# Patient Record
Sex: Female | Born: 1950
Health system: Southern US, Community
[De-identification: ages and names within clinical notes are randomized; demographics above are authoritative.]

## PROBLEM LIST (undated history)

## (undated) DIAGNOSIS — C439 Malignant melanoma of skin, unspecified: Secondary | ICD-10-CM

## (undated) DIAGNOSIS — F329 Major depressive disorder, single episode, unspecified: Secondary | ICD-10-CM

## (undated) DIAGNOSIS — K219 Gastro-esophageal reflux disease without esophagitis: Secondary | ICD-10-CM

## (undated) DIAGNOSIS — F32A Depression, unspecified: Secondary | ICD-10-CM

## (undated) DIAGNOSIS — R9439 Abnormal result of other cardiovascular function study: Secondary | ICD-10-CM

## (undated) HISTORY — DX: Malignant melanoma of skin, unspecified: C43.9

## (undated) HISTORY — DX: Major depressive disorder, single episode, unspecified: F32.9

## (undated) HISTORY — DX: Abnormal result of other cardiovascular function study: R94.39

## (undated) HISTORY — PX: OTHER SURGICAL HISTORY: SHX169

## (undated) HISTORY — DX: Gastro-esophageal reflux disease without esophagitis: K21.9

## (undated) HISTORY — DX: Depression, unspecified: F32.A

---
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
## 1991-06-17 HISTORY — PX: ABDOMINAL HYSTERECTOMY: SHX81

## 2013-06-16 HISTORY — PX: BREAST BIOPSY: SHX20

## 2015-01-11 ENCOUNTER — Telehealth: Payer: Self-pay | Admitting: Family Medicine

## 2015-01-11 NOTE — Telephone Encounter (Signed)
Pt has new pt appt sch for 02-05-15. Pt can not kept that appt and would like to resch. Can I resch pt did not give a reason?

## 2015-01-11 NOTE — Telephone Encounter (Signed)
Pt has been re-sch

## 2015-01-11 NOTE — Telephone Encounter (Signed)
Ok

## 2015-01-11 NOTE — Telephone Encounter (Signed)
lmom for pt to call back

## 2015-02-05 ENCOUNTER — Ambulatory Visit: Payer: Self-pay | Admitting: Family Medicine

## 2015-03-06 ENCOUNTER — Encounter: Payer: Self-pay | Admitting: Family Medicine

## 2015-03-06 ENCOUNTER — Ambulatory Visit (INDEPENDENT_AMBULATORY_CARE_PROVIDER_SITE_OTHER): Payer: BLUE CROSS/BLUE SHIELD | Admitting: Family Medicine

## 2015-03-06 VITALS — BP 122/70 | HR 86 | Temp 97.9°F | Ht 64.0 in | Wt 134.2 lb

## 2015-03-06 DIAGNOSIS — F411 Generalized anxiety disorder: Secondary | ICD-10-CM | POA: Diagnosis not present

## 2015-03-06 DIAGNOSIS — M62838 Other muscle spasm: Secondary | ICD-10-CM

## 2015-03-06 DIAGNOSIS — G4733 Obstructive sleep apnea (adult) (pediatric): Secondary | ICD-10-CM

## 2015-03-06 DIAGNOSIS — G4710 Hypersomnia, unspecified: Secondary | ICD-10-CM

## 2015-03-06 DIAGNOSIS — R400 Somnolence: Secondary | ICD-10-CM

## 2015-03-06 DIAGNOSIS — E039 Hypothyroidism, unspecified: Secondary | ICD-10-CM | POA: Diagnosis not present

## 2015-03-06 DIAGNOSIS — R0789 Other chest pain: Secondary | ICD-10-CM

## 2015-03-06 NOTE — Progress Notes (Signed)
Pre visit review using our clinic review tool, if applicable. No additional management support is needed unless otherwise documented below in the visit note. 

## 2015-03-06 NOTE — Patient Instructions (Signed)
BEFORE YOU LEAVE: -schedule follow up in 2 months  -We placed a referral for you as discussed to the neurologist for the sensations in your leg and for a stress test for your heart for the spells you have had. It usually takes about 1-2 weeks to process and schedule this referral. If you have not heard from [place] regarding this appointment in 2 weeks please contact our office.  Please start cognitive behavioral therapy for stress and possible panic.  Please talk with your integrative medicine doctor about the symptoms you are having and the possibility of interactions or side effects related to your supplements and hormones.

## 2015-03-06 NOTE — Progress Notes (Signed)
HPI:  Olivia Morrison is here to establish care. Has not had a family doctor in some time. She goes to [facility] for her hypothyroidism and her hot flashes. She had extensive labs (CMP, hormones, TFTs, vit D, cortisol)  recently with her integrative medicine doctor. Copy sent to scan - reviewed. She is on nature-thryoid, estradiol and testosterone cream. She is working on eating a healthy diet. Her mother and brother in law died recently. See seems to be doing ok with this. Sees Dr. Edwyna Shell for her pap smears in women's exams. For > 3 years she has fairly frequent muscle twitches in both calves. Denies weakness, numbness, bowel and bladder incontinence. She reports she saw a neurologist for this and they thought might be restless leg - reports no studies or labs done. She was not happy with this neurologist and would like my recommendations to see another. Better with essential oils. Husband thinks has sleep apnea - unrestful sleep, snoring, apneic spells in sleep and daytime somnolence. Occ nightmares. Occ feels very tired, mild CP, SOB for a few minutes - this has occurred 3 times in the last several years. She takes a very long list of supplements. Hx of severe manic depression and GAD. She refuses all vaccines.  ROS negative for unless reported above: fevers, unintentional weight loss, hearing or vision loss, hemoptysis, melena, hematochezia, hematuria, falls, loc, si, thoughts of self harm  Past Medical History  Diagnosis Date  . Depression   . GERD (gastroesophageal reflux disease)   . Hypertension     Past Surgical History  Procedure Laterality Date  . Abdominal hysterectomy    . Breast biopsy Right 2015  . Tonsillectomy  1959    Family History  Problem Relation Age of Onset  . Alcoholism Father   . Arthritis Mother   . Arthritis Sister   . Arthritis Father   . Breast cancer      Paternal cousin  . Heart disease Father   . Heart disease Mother   .  Hypertension Father   . Hypertension Mother   . Mental illness Maternal Aunt     Social History   Social History  . Marital Status: Married    Spouse Name: N/A  . Number of Children: N/A  . Years of Education: N/A   Social History Main Topics  . Smoking status: Not on file  . Smokeless tobacco: Not on file  . Alcohol Use: Not on file  . Drug Use: Not on file  . Sexual Activity: Not on file   Other Topics Concern  . Not on file   Social History Narrative  . No narrative on file     Current outpatient prescriptions:  .  5-Methyltetrahydrofolate (METHYL FOLATE) POWD, by Does not apply route., Disp: , Rfl:  .  Ascorbic Acid (VITAMIN C) 1000 MG tablet, Take 1,000 mg by mouth daily., Disp: , Rfl:  .  ASHWAGANDHA PO, Take by mouth., Disp: , Rfl:  .  Cholecalciferol (VITAMIN D3) 50000 UNITS CAPS, Take by mouth., Disp: , Rfl:  .  Coenzyme Q10 (CO Q-10) 100 MG CAPS, Take by mouth., Disp: , Rfl:  .  estradiol (VIVELLE-DOT) 0.05 MG/24HR patch, , Disp: , Rfl: 4 .  flurazepam (DALMANE) 15 MG capsule, Take 15 mg by mouth. Twice a month, Disp: , Rfl:  .  Magnesium Malate POWD, by Does not apply route., Disp: , Rfl:  .  Menaquinone-7 (VITAMIN K2 PO), Take by mouth., Disp: , Rfl:  .  Multiple Vitamins-Minerals (MULTIVITAMIN PO), Take by mouth., Disp: , Rfl:  .  NATURE-THROID 65 MG tablet, , Disp: , Rfl: 2 .  NON FORMULARY, Testosterone cream, Disp: , Rfl:  .  NON FORMULARY, Pregnelone, Disp: , Rfl:  .  NON FORMULARY, Bio-curcumin 400mg , Disp: , Rfl:  .  Omega-3 Fatty Acids (OMEGA 3 PO), Take by mouth., Disp: , Rfl:  .  Probiotic Product (PROBIOTIC PO), Take by mouth., Disp: , Rfl:  .  Progesterone Micronized (PROGESTERONE PO), Take by mouth., Disp: , Rfl:   EXAM:  Filed Vitals:   03/06/15 1437  BP: 122/70  Pulse: 86  Temp: 97.9 F (36.6 C)    Body mass index is 23.02 kg/(m^2).  GENERAL: vitals reviewed and listed above, alert, oriented, appears well hydrated and in no acute  distress  HEENT: atraumatic, conjunttiva clear, no obvious abnormalities on inspection of external nose and ears  NECK: no obvious masses on inspection  LUNGS: clear to auscultation bilaterally, no wheezes, rales or rhonchi, good air movement  CV: HRRR, no peripheral edema  MS: moves all extremities without noticeable abnormality  PSYCH: pleasant and cooperative, no obvious depression or anxiety  ASSESSMENT AND PLAN:  Discussed the following assessment and plan:  Hypothyroidism, unspecified hypothyroidism type  Atypical chest pain - Plan: Exercise Tolerance Test  GAD (generalized anxiety disorder)  Muscle spasm of both lower legs - Plan: Ambulatory referral to Neurology  Daytime somnolence - Plan: Ambulatory referral to Pulmonology  OSA (obstructive sleep apnea) - Plan: Ambulatory referral to Pulmonology   I am concerned with the number of supplements and hormones she is taking and wonder if any of these, along with anxiety could be contributing to some of her symptoms. Advise follow up with her integrative doc regarding supplements and hormones. He labs look ok on recent testing - scanned. Will have her see neurology for what sounds like chronic constant worsening muscle twitching in the lower extremities - referral placed. Will do stress test for her intermittent spells of atypical cp with dyspnea - query panic disorder and advised tx - she prefers to see her counselor. Consider holter monitor if persists. Referral per her request for testing for OSA with pulmonologist. Follow up in 1-2 months. -We reviewed the PMH, PSH, FH, SH, Meds and Allergies. -We provided refills for any medications we will prescribe as needed. -We addressed current concerns per orders and patient instructions. -We have asked for records for pertinent exams, studies, vaccines and notes from previous providers. -We have advised patient to follow up per instructions below.   -Patient advised to return or  notify a doctor immediately if symptoms worsen or persist or new concerns arise.  Patient Instructions  BEFORE YOU LEAVE: -schedule follow up in 2 months  -We placed a referral for you as discussed to the neurologist for the sensations in your leg and for a stress test for your heart for the spells you have had. It usually takes about 1-2 weeks to process and schedule this referral. If you have not heard from [place] regarding this appointment in 2 weeks please contact our office.  Please start cognitive behavioral therapy for stress and possible panic.  Please talk with your integrative medicine doctor about the symptoms you are having and the possibility of interactions or side effects related to your supplements and hormones.     Colin Benton R.

## 2015-04-05 ENCOUNTER — Inpatient Hospital Stay (HOSPITAL_COMMUNITY): Admission: RE | Admit: 2015-04-05 | Payer: BLUE CROSS/BLUE SHIELD | Source: Ambulatory Visit

## 2015-04-16 ENCOUNTER — Ambulatory Visit (INDEPENDENT_AMBULATORY_CARE_PROVIDER_SITE_OTHER): Payer: BLUE CROSS/BLUE SHIELD | Admitting: Neurology

## 2015-04-16 ENCOUNTER — Encounter: Payer: Self-pay | Admitting: Neurology

## 2015-04-16 VITALS — BP 140/80 | HR 64 | Ht 64.0 in | Wt 136.4 lb

## 2015-04-16 DIAGNOSIS — G3184 Mild cognitive impairment, so stated: Secondary | ICD-10-CM | POA: Diagnosis not present

## 2015-04-16 DIAGNOSIS — G709 Myoneural disorder, unspecified: Secondary | ICD-10-CM

## 2015-04-16 DIAGNOSIS — R253 Fasciculation: Secondary | ICD-10-CM

## 2015-04-16 NOTE — Patient Instructions (Addendum)
Return to clinic if your symptoms worsen.

## 2015-04-16 NOTE — Progress Notes (Signed)
[facility] Clinic Note - Initial Visit   Date: 04/16/2015  Olivia Morrison MRN: [id number] DOB: 1951/01/30   Dear Dr. Maudie Mercury:  Thank you for your kind referral of Olivia Morrison for consultation of muscle twitches. Although her history is well known to you, please allow [place] to reiterate it for the purpose of our medical record. The patient was accompanied to the clinic by self.    History of Present Illness: Olivia Morrison is a 64 y.o. right-handed Caucasian female with depression, GERD, hypertension, and hypothyroidism presenting for evaluation of muscle twitches.    Since 2013, she has noticed painless muscle twitches involves both calves. Symptoms are variable and usually most noticeable when still.   It is improved by tonic water and not as severe, now that she is off her supplements (vitamin K, magnesium, probiotic, vitamin D, coenzyme Q10, vitamin D, pregnelone, curcumin, ashwaganda being managed by Integrative Therapy). She cannot trigger.   There is no associated muscle weakness, numbness/tingling, or low back pain.  She previously saw a neurologist in [facility] who started her on neurontin, but she did not tolerate this.    She also complain of muscle cramps involving the feet.  Her mother also used to complain of muscle cramps, but no muscle twitches.    She also complain of memory problems and word-finding difficulty, which her children have also noticed.   She had ECT for depression in 2000.  Out-side paper records, electronic medical record, and images have been reviewed where available and summarized as:  Labs 01/23/2015:  CRP 2.02, vitamin B12 835, fT4 0.96, vitamin D 61, GGT 46    Past Medical History  Diagnosis Date  . Depression   . GERD (gastroesophageal reflux disease)   . Hypertension     Past Surgical History  Procedure Laterality Date  . Abdominal hysterectomy    . Breast biopsy Right 2015  . Tonsillectomy  1959      Medications:  Outpatient Encounter Prescriptions as of 04/16/2015  Medication Sig Note  . 5-Methyltetrahydrofolate (METHYL FOLATE) POWD by Does not apply route.   . Ascorbic Acid (VITAMIN C) 1000 MG tablet Take 1,000 mg by mouth daily.   . ASHWAGANDHA PO Take by mouth.   . Cholecalciferol (VITAMIN D3) 50000 UNITS CAPS Take by mouth.   . Coenzyme Q10 (CO Q-10) 100 MG CAPS Take by mouth.   . estradiol (VIVELLE-DOT) 0.05 MG/24HR patch  03/06/2015: Received from: External Pharmacy  . flurazepam (DALMANE) 15 MG capsule Take 15 mg by mouth. Twice a month   . Magnesium Malate POWD by Does not apply route.   . Menaquinone-7 (VITAMIN K2 PO) Take by mouth.   . Multiple Vitamins-Minerals (MULTIVITAMIN PO) Take by mouth.   Marland Kitchen NATURE-THROID 65 MG tablet  03/06/2015: Received from: External Pharmacy  . NON FORMULARY Testosterone cream   . NON FORMULARY Pregnelone   . NON FORMULARY Bio-curcumin 400mg    . Omega-3 Fatty Acids (OMEGA 3 PO) Take by mouth.   . Probiotic Product (PROBIOTIC PO) Take by mouth.   . Progesterone Micronized (PROGESTERONE PO) Take by mouth.    No facility-administered encounter medications on file as of 04/16/2015.     Allergies:  Allergies  Allergen Reactions  . Tetanus Toxoids Other (See Comments)    Symptoms of lockjaw and flu per patient    Family History: Family History  Problem Relation Age of Onset  . Alcoholism Father   . Arthritis Mother   . Arthritis Sister   .  Arthritis Father   . Breast cancer      Paternal cousin  . Heart disease Father   . Heart disease Mother   . Hypertension Father   . Hypertension Mother   . Mental illness Maternal Aunt     Social History: Social History  Substance Use Topics  . Smoking status: Former Research scientist (life sciences)  . Smokeless tobacco: Never Used  . Alcohol Use: 0.0 oz/week    0 Standard drinks or equivalent per week   Social History   Social History Narrative    Review of Systems:  CONSTITUTIONAL: No fevers,  chills, night sweats, or weight loss.   EYES: No visual changes or eye pain ENT: No hearing changes.  No history of nose bleeds.   RESPIRATORY: No cough, wheezing and shortness of breath.   CARDIOVASCULAR: Negative for chest pain, and palpitations.   GI: Negative for abdominal discomfort, blood in stools or black stools.  No recent change in bowel habits.   GU:  No history of incontinence.   MUSCLOSKELETAL: No history of joint pain or swelling.  No myalgias.   SKIN: Negative for lesions, rash, and itching.   HEMATOLOGY/ONCOLOGY: Negative for prolonged bleeding, bruising easily, and swollen nodes.  No history of cancer.   ENDOCRINE: Negative for cold or heat intolerance, polydipsia or goiter.   PSYCH:  +depression or anxiety symptoms.   NEURO: As Above.   Vital Signs:  BP 140/80 mmHg  Pulse 64  Ht 5\' 4"  (1.626 m)  Wt 136 lb 7 oz (61.888 kg)  BMI 23.41 kg/m2  SpO2 99% Pain Scale: 0 on a scale of 0-10  Montreal Cognitive Assessment  04/16/2015  Visuospatial/ Executive (0/5) 5  Naming (0/3) 3  Attention: Read list of digits (0/2) 2  Attention: Read list of letters (0/1) 1  Attention: Serial 7 subtraction starting at 100 (0/3) 3  Language: Repeat phrase (0/2) 2  Language : Fluency (0/1) 1  Abstraction (0/2) 2  Delayed Recall (0/5) 4  Orientation (0/6) 6  Total 29  Adjusted Score (based on education) 29    General Medical Exam:   General:  Well appearing, comfortable.   Eyes/ENT: see cranial nerve examination.   Neck: No masses appreciated.  Full range of motion without tenderness.  No carotid bruits. Respiratory:  Clear to auscultation, good air entry bilaterally.   Cardiac:  Regular rate and rhythm, no murmur.   Extremities:  No deformities, edema, or skin discoloration.  Skin:  No rashes or lesions.  Neurological Exam: MENTAL STATUS including orientation to time, place, person, recent and remote memory, attention span and concentration, language, and fund of knowledge is  normal.  Speech is not dysarthric.  CRANIAL NERVES: II:  No visual field defects.  Unremarkable fundi.   III-IV-VI: Pupils equal round and reactive to light.  Normal conjugate, extra-ocular eye movements in all directions of gaze.  No nystagmus.  No ptosis.   V:  Normal facial sensation.     VII:  Normal facial symmetry and movements.  No pathologic facial reflexes.  VIII:  Normal hearing and vestibular function.   IX-X:  Normal palatal movement.   XI:  Normal shoulder shrug and head rotation.   XII:  Normal tongue strength and range of motion, no deviation or fasciculation.  MOTOR:  No atrophy, fasciculations or abnormal movements.  No pronator drift.  Tone is normal.    Right Upper Extremity:    Left Upper Extremity:    Deltoid  5/5   Deltoid  5/5   Biceps  5/5   Biceps  5/5   Triceps  5/5   Triceps  5/5   Wrist extensors  5/5   Wrist extensors  5/5   Wrist flexors  5/5   Wrist flexors  5/5   Finger extensors  5/5   Finger extensors  5/5   Finger flexors  5/5   Finger flexors  5/5   Dorsal interossei  5/5   Dorsal interossei  5/5   Abductor pollicis  5/5   Abductor pollicis  5/5   Tone (Ashworth scale)  0  Tone (Ashworth scale)  0   Right Lower Extremity:    Left Lower Extremity:    Hip flexors  5/5   Hip flexors  5/5   Hip extensors  5/5   Hip extensors  5/5   Knee flexors  5/5   Knee flexors  5/5   Knee extensors  5/5   Knee extensors  5/5   Dorsiflexors  5/5   Dorsiflexors  5/5   Plantarflexors  5/5   Plantarflexors  5/5   Toe extensors  5/5   Toe extensors  5/5   Toe flexors  5/5   Toe flexors  5/5   Tone (Ashworth scale)  0  Tone (Ashworth scale)  0   MSRs:  Right                                                                 Left brachioradialis 2+  brachioradialis 2+  biceps 2+  biceps 2+  triceps 2+  triceps 2+  patellar 2+  patellar 2+  ankle jerk 2+  ankle jerk 2+  Hoffman no  Hoffman no  plantar response down  plantar response down   SENSORY:  Normal and  symmetric perception of light touch, pinprick, vibration, and proprioception.  Romberg's sign absent.   COORDINATION/GAIT: Normal finger-to- nose-finger and heel-to-shin.  Intact rapid alternating movements bilaterally.  Able to rise from a chair without using arms.  Gait narrow based and stable. Tandem and stressed gait intact.    IMPRESSION: Ms. Inclan is a 64 year-old female presenting for evaluation of benign fasciculations, muscle cramps, and memory changes.  Her exam is entirely normal and non-focal.  I reassured her that she most likely has benign fasciculation-cramp syndrome and offered muscle relaxants at bedtime, but she declined.  Alternatively, she should perform posterior leg stretches before bedtime, stay well-hydrated, and continue to avoid supplements, especially as this has helped over the past week.  I can certainly perform NCS/EMG of the legs if she developed worsening symptoms or weakness.   She also may have mild cognitive impairment, but scored extremely well on MOCA with 29/30.  No signs of dementia which she was relieved by.  She does endorse stress and anxiety related to the recent passing of her mother.  Encouraged her to stay physically and mentally active.    Return to clinic if symptoms worsen.    The duration of this appointment visit was 50 minutes of face-to-face time with the patient.  Greater than 50% of this time was spent in counseling, explanation of diagnosis, planning of further management, and coordination of care.   Thank you for allowing me to participate in patient's care.  If  I can answer any additional questions, I would be pleased to do so.    Sincerely,    Siddhanth Denk K. Posey Pronto, DO

## 2015-04-17 ENCOUNTER — Telehealth: Payer: Self-pay | Admitting: *Deleted

## 2015-04-17 NOTE — Telephone Encounter (Signed)
04/13/2015--Dr Kim received lab tests from [place] and wanted to know if the physician that ordered these tests will be following the pt due to elevated calcium and TSH levels and if not to have her come here or see the integrative med physician.  I called Dr Lorie Apley office and spoke with Butch Penny and she stated they are the pts GI doctor and advised the PCP follow the pt for this.  I called the pt and informed her of this and per Dr Maudie Mercury she should have the tests repeated here in 1 month or with the integrative med physician and she stated she has an upcoming appt.  I also advised her per Dr Maudie Mercury to discontinue any supplements she may be taking and she agreed.

## 2015-04-18 ENCOUNTER — Encounter: Payer: Self-pay | Admitting: Internal Medicine

## 2015-04-18 ENCOUNTER — Telehealth (HOSPITAL_COMMUNITY): Payer: Self-pay

## 2015-04-18 ENCOUNTER — Ambulatory Visit (INDEPENDENT_AMBULATORY_CARE_PROVIDER_SITE_OTHER): Payer: BLUE CROSS/BLUE SHIELD | Admitting: Internal Medicine

## 2015-04-18 VITALS — BP 130/78 | HR 70 | Ht 64.0 in | Wt 138.0 lb

## 2015-04-18 DIAGNOSIS — G4733 Obstructive sleep apnea (adult) (pediatric): Secondary | ICD-10-CM | POA: Diagnosis not present

## 2015-04-18 MED ORDER — FAMOTIDINE 20 MG PO TABS: ORAL_TABLET | ORAL | Status: DC

## 2015-04-18 MED ORDER — ALPRAZOLAM 0.5 MG PO TABS: ORAL_TABLET | ORAL | Status: DC

## 2015-04-18 NOTE — Progress Notes (Signed)
Subjective:     Patient ID: Olivia Morrison, female   DOB: 05/29/1951  MRN: [id number]  HPI  34 yowf quit smoking 2008 p only 5 years with chronic insomnia x then better p rx with dalmane per GYN  last used sept 2016 but only used maybe once a month referred to pulmonary clinic 04/18/2015 by Dr Colin Benton for sleep eval.   04/18/2015 1st [facility] office visit/ Olivia Morrison   Chief Complaint  Patient presents with  . Sleep Consult    Referred by Dr Colin Benton. Pt c/o snoring, trouble staying asleep, vivid dreams and nightmares. She has not yet had PSG.   snoring was bad enough to wake her husband but on dalmane slept thru it and woke up feeling refreshed but doesn't want to get addicted to benzo's  Epworth score = 10  Poor sleep hygiene, watches TV until bedtime. ? Some restless legs also with cramping > see neuro eval 04/16/15  Sometimes has cp and overt hb at hs not takng anything for it / never happens with exercise  No obvious day to day or daytime variability or assoc chronic cough  or chest tightness, subjective wheeze or overt sinus  symptoms. No unusual exp hx or h/o childhood pna/ asthma or knowledge of premature birth.   Also denies any obvious fluctuation of symptoms with weather or environmental changes or other aggravating or alleviating factors except as outlined above   Current Medications, Allergies, Complete Past Medical History, Past Surgical History, Family History, and Social History were reviewed in [employer] record.  ROS  The following are not active complaints unless bolded sore throat, dysphagia, dental problems, itching, sneezing,  nasal congestion or excess/ purulent secretions, ear ache,   fever, chills, sweats, unintended wt loss, classically pleuritic or exertional cp, hemoptysis,  orthopnea pnd or leg swelling, presyncope, palpitations, abdominal pain, anorexia, nausea, vomiting, diarrhea  or change in bowel or bladder habits, change in  stools or urine, dysuria,hematuria,  rash, arthralgias, visual complaints, headache, numbness, weakness or ataxia or problems with walking or coordination,  change in mood/affect or memory.       Review of Systems     Objective:   Physical Exam amb wf nad  Wt Readings from Last 3 Encounters:  04/18/15 138 lb (62.596 kg)  04/16/15 136 lb 7 oz (61.888 kg)  03/06/15 134 lb 3.2 oz (60.873 kg)    Vital signs reviewed   HEENT: nl dentition, turbinates, and oropharynx. Nl external ear canals without cough reflex Modified Mallampati Score = 1       NECK :  without JVD/Nodes/TM/ nl carotid upstrokes bilaterally   LUNGS: no acc muscle use, clear to A and P bilaterally without cough on insp or exp maneuvers   CV:  RRR  no s3 or murmur or increase in P2, no edema   ABD:  soft and nontender with nl excursion in the supine position. No bruits or organomegaly, bowel sounds nl  MS:  warm without deformities, calf tenderness, cyanosis or clubbing  SKIN: warm and dry without lesions    NEURO:  alert, approp, no deficits        Assessment:

## 2015-04-18 NOTE — Telephone Encounter (Signed)
Encounter complete. 

## 2015-04-18 NOTE — Patient Instructions (Addendum)
Please see patient coordinator before you leave today  to schedule split night sleep study and I will refer you to one of our sleep docs if it's positive   Read by a dull light for about 30 minutes before sleeping  - repeat if wake up and can't go back to sleep within 10 minutes and if all else fails ok to take the xanax 0.5 mg one half to one at bedtime on occasions  Pepcid 20 mg at bedtime to see if any of the night time chest discomfort improves  GERD (REFLUX)  is an extremely common cause of respiratory symptoms just like yours , many times with no obvious heartburn at all.    It can be treated with medication, but also with lifestyle changes including elevation of the head of your bed (ideally with 6 inch  bed blocks),  Smoking cessation, avoidance of late meals, excessive alcohol, and avoid fatty foods, chocolate, peppermint, colas, red wine, and acidic juices such as orange juice.  NO MINT OR MENTHOL PRODUCTS SO NO COUGH DROPS  USE SUGARLESS CANDY INSTEAD (Jolley ranchers or Stover's or Life Savers) or even ice chips will also do - the key is to swallow to prevent all throat clearing. NO OIL BASED VITAMINS - use powdered substitutes.

## 2015-04-19 ENCOUNTER — Encounter: Payer: Self-pay | Admitting: Internal Medicine

## 2015-04-19 NOTE — Assessment & Plan Note (Addendum)
Interesting hx of elimination of symptoms with benzo's but still disturbed her husband so doesn't want to use them.  She certainly does not have the usual body habitus for osa and actually may not have it all based on the hx and exam but rec proceed with split night study then trial of cpap if approp and f/u with one of our sleep docs  In meantime ok to use xanax 0.5 up to one at hs but not on a regular basis and try GERD recs/ pepcid 20 mg qhs x one month just to see if any of her noct complaints are gerd related (the cp at hs but not with ex is suggestive)   I had an extended discussion with the patient reviewing all relevant studies completed to date and  lasting 63minutes of a 60 minute visit    Sleep hygiene reviewed  Discussed in detail all the  indications, usual  risks and alternatives  relative to the benefits with patient who agrees to proceed with w/u as outlined     Each maintenance medication was reviewed in detail including most importantly the difference between maintenance and prns and under what circumstances the prns are to be triggered using an action plan format that is not reflected in the computer generated alphabetically organized AVS.    Please see instructions for details which were reviewed in writing and the patient given a copy highlighting the part that I personally wrote and discussed at today's ov.

## 2015-04-20 ENCOUNTER — Ambulatory Visit (HOSPITAL_COMMUNITY)
Admission: RE | Admit: 2015-04-20 | Discharge: 2015-04-20 | Disposition: A | Discharge status: Home / Self Care | Payer: BLUE CROSS/BLUE SHIELD | Source: Ambulatory Visit | Attending: Cardiovascular Disease | Admitting: Cardiovascular Disease

## 2015-04-20 ENCOUNTER — Telehealth (HOSPITAL_COMMUNITY): Payer: Self-pay

## 2015-04-20 DIAGNOSIS — R0789 Other chest pain: Secondary | ICD-10-CM | POA: Diagnosis not present

## 2015-04-20 LAB — EXERCISE TOLERANCE TEST
CHL RATE OF PERCEIVED EXERTION: 16
CSEPED: 10 min
CSEPEDS: 1 s
CSEPEW: 11.7 METS
MPHR: 158 {beats}/min
Peak HR: 173 {beats}/min
Percent HR: 109 %
Rest HR: 83 {beats}/min

## 2015-04-20 NOTE — Telephone Encounter (Signed)
Encounter complete. 

## 2015-04-23 ENCOUNTER — Other Ambulatory Visit: Payer: Self-pay | Admitting: *Deleted

## 2015-04-23 DIAGNOSIS — R9439 Abnormal result of other cardiovascular function study: Secondary | ICD-10-CM

## 2015-04-24 ENCOUNTER — Telehealth: Payer: Self-pay | Admitting: Family Medicine

## 2015-04-24 NOTE — Telephone Encounter (Signed)
Talked with pt. She has appt with cardiology next week. No cardiac symptoms currently. She plans to call pulm about sleep study timing.

## 2015-04-24 NOTE — Telephone Encounter (Signed)
Pt would like to know if she should go ahead with the sleep study on Wed night considering her stress test results. Pt would also like a call back from [facility] or Dr Maudie Mercury to go over specifics of reuslts from stress test.  Pt would like a call back asap.

## 2015-04-25 ENCOUNTER — Ambulatory Visit (HOSPITAL_BASED_OUTPATIENT_CLINIC_OR_DEPARTMENT_OTHER): Payer: BLUE CROSS/BLUE SHIELD | Attending: Internal Medicine | Admitting: Radiology

## 2015-04-25 DIAGNOSIS — R0683 Snoring: Secondary | ICD-10-CM | POA: Insufficient documentation

## 2015-04-25 DIAGNOSIS — R5383 Other fatigue: Secondary | ICD-10-CM | POA: Insufficient documentation

## 2015-04-25 DIAGNOSIS — G4733 Obstructive sleep apnea (adult) (pediatric): Secondary | ICD-10-CM

## 2015-04-25 DIAGNOSIS — G4736 Sleep related hypoventilation in conditions classified elsewhere: Secondary | ICD-10-CM | POA: Insufficient documentation

## 2015-04-26 DIAGNOSIS — G4710 Hypersomnia, unspecified: Secondary | ICD-10-CM | POA: Diagnosis not present

## 2015-04-26 NOTE — Progress Notes (Signed)
Patient Name: Olivia Morrison, Olivia Morrison Date: 04/25/2015 Gender: Female D.O.B: [birth year] Age (years): 74 Referring Provider: Tanda Rockers Height (inches): 87 Interpreting Physician: Kara Mead MD, ABSM Weight (lbs): 136 RPSGT: Laren Everts BMI: 23 MRN: [id number] Neck Size: 12.00   CLINICAL INFORMATION Sleep Study Type: NPSG Indication for sleep study: Fatigue, OSA, Snoring, Witnessed Apneas Epworth Sleepiness Score: 10   SLEEP STUDY TECHNIQUE As per the AASM Manual for the Scoring of Sleep and Associated Events v2.3 (April 2016) with a hypopnea requiring 4% desaturations. The channels recorded and monitored were frontal, central and occipital EEG, electrooculogram (EOG), submentalis EMG (chin), nasal and oral airflow, thoracic and abdominal wall motion, anterior tibialis EMG, snore microphone, electrocardiogram, and pulse oximetry.   MEDICATIONS  Medications self-administered by patient during sleep study : No sleep medicine administered.   SLEEP ARCHITECTURE The study was initiated at 10:06:53 PM and ended at 4:48:03 AM. Sleep onset time was 76.2 minutes and the sleep efficiency was 70.8%. The total sleep time was 284.0 minutes. Stage REM latency was 68.5 minutes. The patient spent 6.16% of the night in stage N1 sleep, 71.83% in stage N2 sleep, 0.00% in stage N3 and 22.01% in REM. Alpha intrusion was absent. Supine sleep was 59.16%.   RESPIRATORY PARAMETERS The overall apnea/hypopnea index (AHI) was 1.1 per hour. There were 5 total apneas, including 4 obstructive, 1 central and 0 mixed apneas. There were 0 hypopneas and 34 RERAs. The AHI during Stage REM sleep was 3.8 per hour. AHI while supine was 1.4 per hour. The mean oxygen saturation was 95.73%. Moderate snoring was noted during this study.   CARDIAC DATA The 2 lead EKG demonstrated sinus rhythm. The mean heart rate was 68.45 beats per minute. Other EKG findings include: None.   LEG MOVEMENT DATA The total  PLMS were 1 with a resulting PLMS index of 0.21. Associated arousal with leg movement index was 0.2 .   IMPRESSIONS - No significant obstructive sleep apnea occurred during this study (AHI = 1.1/h). - No significant central sleep apnea occurred during this study (CAI = 0.2/h). - No significant oxygen desaturation was noted during this study  - The patient snored with Moderate snoring volume. - No cardiac abnormalities were noted during this study. - Clinically significant periodic limb movements did not occur during sleep. No significant associated arousals.   DIAGNOSIS - Nocturnal Hypoxemia (327.26 [G47.36 ICD-10])   RECOMMENDATIONS - Avoid alcohol, sedatives and other CNS depressants that may worsen sleep apnea and disrupt normal sleep architecture. - Sleep hygiene should be reviewed to assess factors that may improve sleep quality.  Kara Mead MD. [facility]. [facility]

## 2015-04-30 ENCOUNTER — Telehealth: Payer: Self-pay | Admitting: Internal Medicine

## 2015-04-30 NOTE — Telephone Encounter (Signed)
Let her know sleep study ok, no intervention needed - have her set up f/u to discuss details and get a plan to correct her sleep issues in next 6 weeks or so     ----- Message -----     From: Rigoberto Noel, MD     Sent: 04/26/2015 12:48 PM      To: Leanor Rubenstein, RRT, Tanda Rockers, MD      Spoke with pt and notified of results per Dr. Melvyn Novas. Pt verbalized understanding and denied any questions.  Dr Melvyn Novas- there are no openings with any sleep doc in the next 6 wks. Is it okay to schedule her for first available? Please advise, thanks

## 2015-04-30 NOTE — Telephone Encounter (Signed)
6wk f/u sched for 06/13/15 w/MW, pt cb

## 2015-04-30 NOTE — Telephone Encounter (Signed)
Sorry to confuse - she doesn't need a sleep doc because her sleep study was ok, I was just offering to go over it with her in more detail and come up with a plan going forward to address her sleep issues

## 2015-04-30 NOTE — Telephone Encounter (Signed)
lmtcb for pt.  

## 2015-05-01 ENCOUNTER — Ambulatory Visit (INDEPENDENT_AMBULATORY_CARE_PROVIDER_SITE_OTHER): Payer: BLUE CROSS/BLUE SHIELD | Admitting: Interventional Cardiology

## 2015-05-01 ENCOUNTER — Encounter: Payer: Self-pay | Admitting: Interventional Cardiology

## 2015-05-01 VITALS — BP 144/92 | HR 73 | Ht 64.0 in | Wt 134.2 lb

## 2015-05-01 DIAGNOSIS — E785 Hyperlipidemia, unspecified: Secondary | ICD-10-CM | POA: Insufficient documentation

## 2015-05-01 DIAGNOSIS — R9430 Abnormal result of cardiovascular function study, unspecified: Secondary | ICD-10-CM

## 2015-05-01 DIAGNOSIS — M549 Dorsalgia, unspecified: Secondary | ICD-10-CM

## 2015-05-01 DIAGNOSIS — R9431 Abnormal electrocardiogram [ECG] [EKG]: Secondary | ICD-10-CM | POA: Insufficient documentation

## 2015-05-01 NOTE — Patient Instructions (Signed)
Medication Instructions:  Your physician recommends that you continue on your current medications as directed. Please refer to the Current Medication list given to you today.   Labwork: None ordered  Testing/Procedures: Your physician has requested that you have en exercise stress myoview. For further information please visit [email or web address]. Please follow instruction sheet, as given.   Follow-Up: Your physician recommends that you schedule a follow-up appointment as needed or pending results   Any Other Special Instructions Will Be Listed Below (If Applicable).     If you need a refill on your cardiac medications before your next appointment, please call your pharmacy.

## 2015-05-01 NOTE — Progress Notes (Signed)
Cardiology Office Note   Date:  05/01/2015   ID:  Olivia Morrison, DOB 07-21-1950, MRN [id number]  PCP:  Lucretia Kern., DO  Cardiologist:  Sinclair Grooms, MD   Chief Complaint  Patient presents with  . Results    abnormal stress test      History of Present Illness: Olivia Morrison is a 64 y.o. female who presents for abnormal exercise EKG.  The patient went through a series of tests including an exercise EKG to evaluate the presence of fatigue and other atypical complaints.. She has had intermittent interscapular discomfort after long periods soaking in the bathtub. These episodes are sharp and always occur when she is getting out of the bathtub. This has occurred on 2 occasions. She has otherwise had no similar complaints. She walked 10 minutes on the treadmill without chest discomfort or back discomfort. She did have 2 mm of ST depression in leads II, III, and aVF. I was unable to personally review the EKG changes.  There is a history of hyperlipidemia. Her father had coronary artery disease prior to the age of 6 and underwent coronary bypass grafting.  She has been a prior smoker but for less than 10 years.  Past Medical History  Diagnosis Date  . Depression   . GERD (gastroesophageal reflux disease)   . Hypertension     Past Surgical History  Procedure Laterality Date  . Abdominal hysterectomy    . Breast biopsy Right 2015  . Tonsillectomy  1959     Current Outpatient Prescriptions  Medication Sig Dispense Refill  . ALPRAZolam (XANAX) 0.5 MG tablet Take 0.25 mg by mouth at bedtime as needed (insomnia).    Marland Kitchen estradiol (VIVELLE-DOT) 0.05 MG/24HR patch Place 1 patch onto the skin 2 (two) times a week.   4  . NATURE-THROID 65 MG tablet Take 65 mg by mouth daily.   2  . PROGESTERONE MICRONIZED PO Take 50 mg by mouth daily.    Marland Kitchen UNABLE TO FIND Apply 1 application topically at bedtime. Med Name: Testosterone cream     No current facility-administered medications  for this visit.    Allergies:   Tetanus toxoids    Social History:  The patient  reports that she quit smoking about 8 years ago. Her smoking use included Cigarettes. She has a 3.75 pack-year smoking history. She has never used smokeless tobacco. She reports that she drinks alcohol. She reports that she does not use illicit drugs.   Family History:  The patient's family history includes Alcoholism in her father; Arthritis in her father, mother, and sister; Asthma in her brother; Breast cancer in an other family member; Healthy in her son; Heart disease in her father and mother; Hypertension in her father and mother; Mental illness in her maternal aunt.    ROS:  Please see the history of present illness.   Otherwise, review of systems are positive for anxiety disorder. She has desire for holistic therapy rather than conventional medicinal therapy..   All other systems are reviewed and negative.    PHYSICAL EXAM: VS:  BP 144/92 mmHg  Pulse 73  Ht 5\' 4"  (1.626 m)  Wt 60.873 kg (134 lb 3.2 oz)  BMI 23.02 kg/m2 , BMI Body mass index is 23.02 kg/(m^2). GEN: Well nourished, well developed, in no acute distress. Relatively slender and appears healthy. HEENT: normal Neck: no JVD, carotid bruits, or masses Cardiac: RRR.  There is no murmur, rub, or gallop. There is no edema. Respiratory:  clear to auscultation bilaterally, normal work of breathing. GI: soft, nontender, nondistended, + BS MS: no deformity or atrophy Skin: warm and dry, no rash Neuro:  Strength and sensation are intact Psych: euthymic mood, full affect   EKG:  EKG is ordered today. The ekg reveals normal sinus rhythm and normal overall appearing tracing.   Recent Labs: No results found for requested labs within last 365 days.    Lipid Panel No results found for: CHOL, TRIG, HDL, CHOLHDL, VLDL, LDLCALC, LDLDIRECT    Wt Readings from Last 3 Encounters:  05/01/15 60.873 kg (134 lb 3.2 oz)  04/25/15 61.689 kg (136 lb)    04/18/15 62.596 kg (138 lb)      Other studies Reviewed: Additional studies/ records that were reviewed today include: Reviewed prior primary care notes and laboratory data [place] at from prior primary care physician.. The findings include no revealing data with reference to this evaluation..    ASSESSMENT AND PLAN:   1. Abnormal exercise treadmill test with ST segment depression but absence of symptoms   2. Recurring interscapular discomfort , nonexertional. Suspect this is musculoskeletal   3. Hyperlipidemia No recent data available. Stated to me that her LDL cholesterol was greater than 200 but HDL was also near 100  Current medicines are reviewed at length with the patient today.  The patient has the following concerns regarding medicines: .  The following changes/actions have been instituted:     Stress Cardiolite  Consider repeat lipid studies  Labs/ tests ordered today include:   Orders Placed This Encounter  Procedures  . Myocardial Perfusion Imaging  . EKG 12-Lead     Disposition:   FU with HS in PRN   Signed, Sinclair Grooms, MD  05/01/2015 3:11 PM    [facility] [address], [place], [place]  [address] Phone: [phone]; Fax: [phone]

## 2015-05-03 ENCOUNTER — Telehealth (HOSPITAL_COMMUNITY): Payer: Self-pay

## 2015-05-03 NOTE — Telephone Encounter (Signed)
Encounter complete. 

## 2015-05-07 ENCOUNTER — Ambulatory Visit (INDEPENDENT_AMBULATORY_CARE_PROVIDER_SITE_OTHER): Payer: BLUE CROSS/BLUE SHIELD | Admitting: Family Medicine

## 2015-05-07 ENCOUNTER — Encounter: Payer: Self-pay | Admitting: Family Medicine

## 2015-05-07 VITALS — BP 126/80 | HR 80 | Temp 97.8°F | Ht 64.0 in | Wt 136.5 lb

## 2015-05-07 DIAGNOSIS — Z634 Disappearance and death of family member: Secondary | ICD-10-CM | POA: Diagnosis not present

## 2015-05-07 DIAGNOSIS — E785 Hyperlipidemia, unspecified: Secondary | ICD-10-CM | POA: Diagnosis not present

## 2015-05-07 DIAGNOSIS — E039 Hypothyroidism, unspecified: Secondary | ICD-10-CM | POA: Diagnosis not present

## 2015-05-07 DIAGNOSIS — C439 Malignant melanoma of skin, unspecified: Secondary | ICD-10-CM

## 2015-05-07 NOTE — Progress Notes (Signed)
Pre visit review using our clinic review tool, if applicable. No additional management support is needed unless otherwise documented below in the visit note. 

## 2015-05-07 NOTE — Progress Notes (Signed)
HPI:  Olivia Morrison is a 64 yo female with a reported hx of severe manic depression and GAD whom recently established care with me. She sees an integrative doctor and is on many supplements and hormones, but feels unwell despite this. She saw several specialists at her request since our last visit. Saw neurology and was diagnosed with benign fasciculation cramp syndrome. The neurologist advised muscle relaxer and or posterior leg stretches. No Sleep apnea on her polysomnogram. She had an abnormal stress test for the CP and dyspnea she reported at her first visit and saw cardiology. She now is scheduled for a perfusion scan. She had extensive labs done with her gastroenterologist and had a mildly high calcium and TSH. Needs to recheck and wants to check free T3/T4 as this is what her integrative doc uses to adjust her nature-throid which she has restarted. Reports: mother passed 2 months ago and is grieving, feels tired, seeing her psychologist. Seeing Dr. [name] for Melanoma - removed. Eating healthy. No exercise until after her nuclear study.  ROS: See pertinent positives and negatives per HPI.  Past Medical History  Diagnosis Date  . Depression     seeing [name] - psychologist  . GERD (gastroesophageal reflux disease)   . Hypertension   . Melanoma ([place])     in 2016, removed; seeing Dr. [name]  . Hyperlipemia   . Abnormal stress test     2016, saw cards, nuclear scan planned    Past Surgical History  Procedure Laterality Date  . Abdominal hysterectomy    . Breast biopsy Right 2015  . Tonsillectomy  1959    Family History  Problem Relation Age of Onset  . Alcoholism Father   . Arthritis Mother   . Arthritis Sister   . Arthritis Father   . Breast cancer      Paternal cousin  . Heart disease Father   . Heart disease Mother   . Hypertension Father   . Hypertension Mother   . Mental illness Maternal Aunt   . Asthma Brother   . Healthy Son     Social History   Social  History  . Marital Status: Married    Spouse Name: N/A  . Number of Children: N/A  . Years of Education: N/A   Social History Main Topics  . Smoking status: Former Smoker -- 0.75 packs/day for 5 years    Types: Cigarettes    Quit date: 04/16/2007  . Smokeless tobacco: Never Used  . Alcohol Use: 0.0 oz/week    0 Standard drinks or equivalent per week     Comment: 1-2 drinks over the weekend, previously 1 drink nightly (martini)  . Drug Use: No  . Sexual Activity: Not Asked   Other Topics Concern  . None   Social History Narrative   Lives with husband in a 2 story home.  Has 2 sons.     Not currently working.  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    Education: some college.     Current outpatient prescriptions:  .  estradiol (VIVELLE-DOT) 0.05 MG/24HR patch, Place 1 patch onto the skin 2 (two) times a week. , Disp: , Rfl: 4 .  NATURE-THROID 65 MG tablet, Take 65 mg by mouth daily. , Disp: , Rfl: 2 .  PROGESTERONE MICRONIZED PO, Take 50 mg by mouth daily., Disp: , Rfl:  .  UNABLE TO FIND, Apply 1 application topically at bedtime. Med Name: Testosterone cream, Disp: , Rfl:  EXAMDanley Danker Vitals:   05/07/15 0932  BP: 126/80  Pulse: 80  Temp: 97.8 F (36.6 C)    Body mass index is 23.42 kg/(m^2).  GENERAL: vitals reviewed and listed above, alert, oriented, appears well hydrated and in no acute distress  HEENT: atraumatic, conjunttiva clear, no obvious abnormalities on inspection of external nose and ears  NECK: no obvious masses on inspection  LUNGS: clear to auscultation bilaterally, no wheezes, rales or rhonchi, good air movement  CV: HRRR, no peripheral edema  MS: moves all extremities without noticeable abnormality  PSYCH: pleasant and cooperative, no obvious depression or anxiety  ASSESSMENT AND PLAN:  Discussed the following assessment and plan:  Hypothyroidism, unspecified hypothyroidism type - Plan: TSH, T3, Free, T4,  Free  Hyperlipidemia - Plan: Lipid Panel  Serum calcium elevated - Plan: PTH, Intact and Calcium  Bereavement  Melanoma of skin ([place])  -fasting labs 2 weeks -will defer to integrative doc for adjustments in naturethroid -supported, seeing psychologist for bereavement -nuclear scan scheduled -getting skin checks q3 months with derm -Patient advised to return or notify a doctor immediately if symptoms worsen or persist or new concerns arise.  There are no Patient Instructions on file for this visit.   Colin Benton R.

## 2015-05-08 ENCOUNTER — Ambulatory Visit (HOSPITAL_COMMUNITY)
Admission: RE | Admit: 2015-05-08 | Discharge: 2015-05-08 | Disposition: A | Discharge status: Home / Self Care | Payer: BLUE CROSS/BLUE SHIELD | Source: Ambulatory Visit | Attending: Cardiovascular Disease | Admitting: Cardiovascular Disease

## 2015-05-08 DIAGNOSIS — R079 Chest pain, unspecified: Secondary | ICD-10-CM | POA: Diagnosis not present

## 2015-05-08 DIAGNOSIS — Z8249 Family history of ischemic heart disease and other diseases of the circulatory system: Secondary | ICD-10-CM | POA: Diagnosis not present

## 2015-05-08 DIAGNOSIS — R002 Palpitations: Secondary | ICD-10-CM | POA: Diagnosis not present

## 2015-05-08 DIAGNOSIS — R9430 Abnormal result of cardiovascular function study, unspecified: Secondary | ICD-10-CM

## 2015-05-08 DIAGNOSIS — R0609 Other forms of dyspnea: Secondary | ICD-10-CM | POA: Insufficient documentation

## 2015-05-08 DIAGNOSIS — R0602 Shortness of breath: Secondary | ICD-10-CM | POA: Insufficient documentation

## 2015-05-08 DIAGNOSIS — Z87891 Personal history of nicotine dependence: Secondary | ICD-10-CM | POA: Insufficient documentation

## 2015-05-08 DIAGNOSIS — G4733 Obstructive sleep apnea (adult) (pediatric): Secondary | ICD-10-CM | POA: Insufficient documentation

## 2015-05-08 DIAGNOSIS — R5383 Other fatigue: Secondary | ICD-10-CM | POA: Insufficient documentation

## 2015-05-08 LAB — MYOCARDIAL PERFUSION IMAGING
CHL CUP NUCLEAR SDS: 0
CHL CUP NUCLEAR SRS: 0
CHL CUP NUCLEAR SSS: 0
CSEPHR: 99 %
Estimated workload: 10.1 METS
Exercise duration (min): 9 min
Exercise duration (sec): 35 s
LV dias vol: 59 mL
LV sys vol: 17 mL
MPHR: 158 {beats}/min
Peak HR: 157 {beats}/min
RPE: 17
Rest HR: 65 {beats}/min
TID: 1.07

## 2015-05-08 MED ORDER — TECHNETIUM TC 99M SESTAMIBI GENERIC - CARDIOLITE
31.6000 | Freq: Once | INTRAVENOUS | Status: AC | PRN
  Administered 2015-05-08: 31.6 via INTRAVENOUS

## 2015-05-08 MED ORDER — TECHNETIUM TC 99M SESTAMIBI GENERIC - CARDIOLITE
10.9000 | Freq: Once | INTRAVENOUS | Status: AC | PRN
  Administered 2015-05-08: 10.9 via INTRAVENOUS

## 2015-05-22 ENCOUNTER — Other Ambulatory Visit: Payer: BLUE CROSS/BLUE SHIELD

## 2015-05-22 LAB — TSH: TSH: 0.63 u[IU]/mL (ref 0.35–4.50)

## 2015-05-22 LAB — LIPID PANEL
CHOLESTEROL: 185 mg/dL (ref 0–200)
HDL: 71.9 mg/dL (ref >39.00)
LDL Cholesterol: 96 mg/dL (ref 0–99)
NonHDL: 113.02
TRIGLYCERIDES: 84 mg/dL (ref 0.0–149.0)
Total CHOL/HDL Ratio: 3
VLDL: 16.8 mg/dL (ref 0.0–40.0)

## 2015-05-22 LAB — T3, FREE: T3, Free: 3.6 pg/mL (ref 2.3–4.2)

## 2015-05-22 LAB — T4, FREE: FREE T4: 0.64 ng/dL (ref 0.60–1.60)

## 2015-05-23 LAB — PTH, INTACT AND CALCIUM
CALCIUM: 9 mg/dL (ref 8.4–10.5)
PTH: 35 pg/mL (ref 14–64)

## 2015-06-13 ENCOUNTER — Ambulatory Visit: Payer: BLUE CROSS/BLUE SHIELD | Admitting: Internal Medicine

## 2015-06-20 ENCOUNTER — Telehealth: Payer: Self-pay | Admitting: Family Medicine

## 2015-06-20 NOTE — Telephone Encounter (Signed)
Pt already has an appt tomorrow

## 2015-06-20 NOTE — Telephone Encounter (Signed)
Needs appt. Please let her know I am not here this afternoon. Can use same day with me tomorrow - or if feels very sick and/or wishes to be seen today then please schedule with another provider or advise [facility] if we are full.

## 2015-06-20 NOTE — Telephone Encounter (Signed)
CALLER STATES THAT SHE IS HAVING A COUGH, COLD FEVER AND CONGESTION.  SHE HAS BEEN FEELING LIKE THIS SINCE SATURDAY.  FEVER OF 100.  COUGH IS INTERMITTENT.  COUGH IS JELLO - NOT GREEN SHE STATES.  SHE FEELS LIKE SHE HAS NO ENERGY.  DOESNT WANT TO EAT.  SHE IS DRINKING HOT TEA AND WATER.

## 2015-06-20 NOTE — Telephone Encounter (Signed)
Please advise 

## 2015-06-21 ENCOUNTER — Encounter: Payer: Self-pay | Admitting: Family Medicine

## 2015-06-21 ENCOUNTER — Ambulatory Visit (INDEPENDENT_AMBULATORY_CARE_PROVIDER_SITE_OTHER): Payer: BLUE CROSS/BLUE SHIELD | Admitting: Family Medicine

## 2015-06-21 VITALS — BP 128/76 | HR 79 | Temp 98.1°F | Ht 64.0 in | Wt 137.8 lb

## 2015-06-21 DIAGNOSIS — J069 Acute upper respiratory infection, unspecified: Secondary | ICD-10-CM

## 2015-06-21 NOTE — Progress Notes (Signed)
HPI:  URI: -started:5 days ago, now better today and fevers have resolved -symptoms:nasal congestion, sore throat, cough, fevers (100.7 high), bodyaches, chills -denies:SOB, NVD, tooth pain, sinus pain -has tried:  Oils, musinex -sick contacts/travel/risks: denies flu exposure, tick exposure or or Ebola risks -did not get flu shot  ROS: See pertinent positives and negatives per HPI.  Past Medical History  Diagnosis Date  . Depression     seeing [name] - psychologist  . GERD (gastroesophageal reflux disease)   . Hypertension   . Melanoma ([place])     in 2016, removed; seeing Dr. [name]  . Hyperlipemia   . Abnormal stress test     2016, saw cards, nuclear scan planned    Past Surgical History  Procedure Laterality Date  . Abdominal hysterectomy    . Breast biopsy Right 2015  . Tonsillectomy  1959    Family History  Problem Relation Age of Onset  . Alcoholism Father   . Arthritis Mother   . Arthritis Sister   . Arthritis Father   . Breast cancer      Paternal cousin  . Heart disease Father   . Heart disease Mother   . Hypertension Father   . Hypertension Mother   . Mental illness Maternal Aunt   . Asthma Brother   . Healthy Son     Social History   Social History  . Marital Status: Married    Spouse Name: N/A  . Number of Children: N/A  . Years of Education: N/A   Social History Main Topics  . Smoking status: Former Smoker -- 0.75 packs/day for 5 years    Types: Cigarettes    Quit date: 04/16/2007  . Smokeless tobacco: Never Used  . Alcohol Use: 0.0 oz/week    0 Standard drinks or equivalent per week     Comment: 1-2 drinks over the weekend, previously 1 drink nightly (martini)  . Drug Use: No  . Sexual Activity: Not Asked   Other Topics Concern  . None   Social History Narrative   Lives with husband in a 2 story home.  Has 2 sons.     Not currently working.  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    Education: some college.      Current outpatient prescriptions:  .  estradiol (VIVELLE-DOT) 0.05 MG/24HR patch, Place 1 patch onto the skin 2 (two) times a week. , Disp: , Rfl: 4 .  NATURE-THROID 65 MG tablet, Take 65 mg by mouth daily. , Disp: , Rfl: 2 .  PROGESTERONE MICRONIZED PO, Take 50 mg by mouth daily., Disp: , Rfl:  .  UNABLE TO FIND, Apply 1 application topically at bedtime. Med Name: Testosterone cream, Disp: , Rfl:   EXAM:  Filed Vitals:   06/21/15 1032  BP: 128/76  Pulse: 79  Temp: 98.1 F (36.7 C)    Body mass index is 23.64 kg/(m^2).  GENERAL: vitals reviewed and listed above, alert, oriented, appears well hydrated and in no acute distress  HEENT: atraumatic, conjunttiva clear, no obvious abnormalities on inspection of external nose and ears, normal appearance of ear canals and TMs, clear nasal congestion, mild post oropharyngeal erythema with PND, no tonsillar edema or exudate, no sinus TTP  NECK: no obvious masses on inspection  LUNGS: clear to auscultation bilaterally, no wheezes, rales or rhonchi, good air movement  CV: HRRR, no peripheral edema  MS: moves all extremities without noticeable abnormality  PSYCH: pleasant and cooperative, no  obvious depression or anxiety  ASSESSMENT AND PLAN:  Discussed the following assessment and plan:  Acute upper respiratory infection  -given HPI and exam findings today, a serious infection or illness is unlikely. We discussed potential etiologies, with VURI of mild flu being most likely, and advised supportive care and monitoring. We discussed treatment side effects, likely course, antibiotic misuse, transmission, and signs of developing a serious illness. She opted again flu test.  -of course, we advised to return or notify a doctor immediately if symptoms worsen or persist or new concerns arise.    There are no Patient Instructions on file for this visit.   Colin Benton R.

## 2015-06-21 NOTE — Progress Notes (Signed)
Pre visit review using our clinic review tool, if applicable. No additional management support is needed unless otherwise documented below in the visit note. 

## 2015-07-05 ENCOUNTER — Ambulatory Visit (HOSPITAL_BASED_OUTPATIENT_CLINIC_OR_DEPARTMENT_OTHER): Payer: BLUE CROSS/BLUE SHIELD

## 2015-07-09 ENCOUNTER — Ambulatory Visit: Payer: BLUE CROSS/BLUE SHIELD | Admitting: Internal Medicine

## 2015-07-19 ENCOUNTER — Institutional Professional Consult (permissible substitution): Payer: BLUE CROSS/BLUE SHIELD | Admitting: Internal Medicine

## 2015-07-23 ENCOUNTER — Ambulatory Visit: Payer: BLUE CROSS/BLUE SHIELD | Admitting: Internal Medicine

## 2015-08-07 ENCOUNTER — Ambulatory Visit (INDEPENDENT_AMBULATORY_CARE_PROVIDER_SITE_OTHER): Payer: BLUE CROSS/BLUE SHIELD | Admitting: Internal Medicine

## 2015-08-07 ENCOUNTER — Encounter: Payer: Self-pay | Admitting: Internal Medicine

## 2015-08-07 VITALS — BP 124/78 | HR 78 | Ht 64.0 in | Wt 139.0 lb

## 2015-08-07 DIAGNOSIS — R0683 Snoring: Secondary | ICD-10-CM

## 2015-08-07 DIAGNOSIS — J310 Chronic rhinitis: Secondary | ICD-10-CM

## 2015-08-07 NOTE — Assessment & Plan Note (Signed)
04/25/15 PSG IMPRESSIONS - No significant obstructive sleep apnea occurred during this study (AHI = 1.1/h). - No significant central sleep apnea occurred during this study (CAI = 0.2/h). - No significant oxygen desaturation was noted during this study  - The patient snored with Moderate snoring volume. - No cardiac abnormalities were noted during this study. - Clinically significant periodic limb movements did not occur during sleep. No significant associated arousals. DIAGNOSIS - Nocturnal Hypoxemia    Not enough hypoxemia to w/u in this setting/ most of her problems still relate to poor hygiene and chronic rhinitis with prev allergy w/u neg but some pos response to flonase - see chronic rhinitis (see separate a/p)   Needs ent eval for snoring next/ f/u Dr Elsworth Soho prn   I had an extended discussion with the patient reviewing all relevant studies completed to date and  lasting 15 to 20 minutes of a 25 minute visit    Each maintenance medication was reviewed in detail including most importantly the difference between maintenance and prns and under what circumstances the prns are to be triggered using an action plan format that is not reflected in the computer generated alphabetically organized AVS.    Please see instructions for details which were reviewed in writing and the patient given a copy highlighting the part that I personally wrote and discussed at today's ov.

## 2015-08-07 NOTE — Patient Instructions (Addendum)
Flonase up to 2puffs every 12 hours in each nostril and taper down to 1-2 pffs at bedtime   I emphasized that nasal steroids have no immediate benefit in terms of improving symptoms.  To help them reached the target tissue, the patient should use Afrin two puffs every 12 hours applied one min before using the nasal steroids.  Afrin should be stopped after no more than 5 days.  If the symptoms worsen, Afrin can be restarted after 5 days off of therapy to prevent rebound congestion from overuse of Afrin.  I also emphasized that in no way are nasal steroids a concern in terms of "addiction".   Please see patient coordinator before you leave today  to schedule ENT eval for snoring   If not happy with sleep you will need to make appt to see Dr Elsworth Soho next available

## 2015-08-07 NOTE — Progress Notes (Signed)
Subjective:     Patient ID: Olivia Morrison, female   DOB: 06-18-1950  MRN: [id number]  HPI  54 yowf quit smoking 2008 p only 5 years with chronic insomnia x then better p rx with dalmane per GYN  last used sept 2016 but only used maybe once a month referred to pulmonary clinic 04/18/2015 by Dr Colin Benton for sleep eval.   04/18/2015 1st [facility] office visit/ Olivia Morrison   Chief Complaint  Patient presents with  . Sleep Consult    Referred by Dr Colin Benton. Pt c/o snoring, trouble staying asleep, vivid dreams and nightmares. She has not yet had PSG.   snoring was bad enough to wake her husband but on dalmane slept thru it and woke up feeling refreshed but doesn't want to get addicted to benzo's  Epworth score = 10  Poor sleep hygiene, watches TV until bedtime. ? Some restless legs also with cramping > see neuro eval 04/16/15  Sometimes has cp and overt hb at hs not takng anything for it / never happens with exercise rec Please see patient coordinator before you leave today  to schedule split night sleep study and I will refer you to one of our sleep docs if it's positive  Read by a dull light for about 30 minutes before sleeping  - repeat if wake up and can't go back to sleep within 10 minutes and if all else fails ok to take the xanax 0.5 mg one half to one at bedtime on occasions Pepcid 20 mg at bedtime to see if any of the night time chest discomfort improves GERD diet    08/07/2015  f/u ov/Olivia Morrison re: snoring only lying flat on back/ chest discomfort gone on apple sider vinegar no pepcid  Chief Complaint  Patient presents with  . Follow-up    Still snoring and can not sleep on her back. She states that she has been using lavendar oil on her feet and sleeping some better.    also nasal congestion worst in am, some better in flonase in past.  No obvious day to day or daytime variability or assoc chronic cough or cp or chest tightness, subjective wheeze or overt  hb symptoms. No unusual  exp hx or h/o childhood pna/ asthma or knowledge of premature birth.  Sleeping ok without nocturnal  or early am exacerbation  of respiratory  c/o's or need for noct saba. Also denies any obvious fluctuation of symptoms with weather or environmental changes or other aggravating or alleviating factors except as outlined above   Current Medications, Allergies, Complete Past Medical History, Past Surgical History, Family History, and Social History were reviewed in [employer] record.  ROS  The following are not active complaints unless bolded sore throat, dysphagia, dental problems, itching, sneezing,  nasal congestion or excess/ purulent secretions, ear ache,   fever, chills, sweats, unintended wt loss, classically pleuritic or exertional cp, hemoptysis,  orthopnea pnd or leg swelling, presyncope, palpitations, abdominal pain, anorexia, nausea, vomiting, diarrhea  or change in bowel or bladder habits, change in stools or urine, dysuria,hematuria,  rash, arthralgias, visual complaints, headache, numbness, weakness or ataxia or problems with walking or coordination,  change in mood/affect or memory.                  Objective:   Physical Exam amb wf nad  08/07/2015        140    04/18/15 138 lb (62.596 kg)  04/16/15 136 lb 7  oz (61.888 kg)  03/06/15 134 lb 3.2 oz (60.873 kg)    Vital signs reviewed   HEENT: nl dentition, and oropharynx. Nl external ear canals without cough reflex/ moderate bilateral nonspecific turbinate edema/ watery discharge Modified Mallampati Score = 1     NECK :  without JVD/Nodes/TM/ nl carotid upstrokes bilaterally   LUNGS: no acc muscle use, clear to A and P bilaterally without cough on insp or exp maneuvers   CV:  RRR  no s3 or murmur or increase in P2, no edema   ABD:  soft and nontender with nl excursion in the supine position. No bruits or organomegaly, bowel sounds nl  MS:  warm without deformities, calf tenderness, cyanosis or  clubbing  SKIN: warm and dry without lesions    NEURO:  alert, approp, no deficits        Assessment:

## 2015-08-07 NOTE — Assessment & Plan Note (Signed)
Reviewed approp use of nasal steroids/ short course afrin  pending f/u by ENT

## 2017-02-05 ENCOUNTER — Ambulatory Visit (INDEPENDENT_AMBULATORY_CARE_PROVIDER_SITE_OTHER): Payer: BLUE CROSS/BLUE SHIELD | Admitting: Family Medicine

## 2017-02-05 ENCOUNTER — Encounter: Payer: Self-pay | Admitting: Family Medicine

## 2017-02-05 VITALS — BP 128/82 | HR 75 | Temp 98.3°F | Ht 64.0 in | Wt 137.2 lb

## 2017-02-05 DIAGNOSIS — E559 Vitamin D deficiency, unspecified: Secondary | ICD-10-CM

## 2017-02-05 DIAGNOSIS — R232 Flushing: Secondary | ICD-10-CM

## 2017-02-05 DIAGNOSIS — E039 Hypothyroidism, unspecified: Secondary | ICD-10-CM

## 2017-02-05 LAB — HEMOGLOBIN A1C: HEMOGLOBIN A1C: 5.9 % (ref 4.6–6.5)

## 2017-02-05 LAB — VITAMIN D 25 HYDROXY (VIT D DEFICIENCY, FRACTURES): VITD: 46.43 ng/mL (ref 30.00–100.00)

## 2017-02-05 NOTE — Patient Instructions (Signed)
BEFORE YOU LEAVE: -follow up: CPE in 1-2 months, come fasting  We have ordered labs or studies at this visit. It can take up to 1-2 weeks for results and processing. IF results require follow up or explanation, we will call you with instructions. Clinically stable results will be released to your [facility]. If you have not heard from [place] or cannot find your results in [facility] in 2 weeks please contact our office at [phone].  If you are not yet signed up for [facility], please consider signing up.  -We placed a referral for you as discussed to the endocrinologist. It usually takes about 1-2 weeks to process and schedule this referral. If you have not heard from [place] regarding this appointment in 2 weeks please contact our office.  WE NOW OFFER   [facility] FAST TRACK!!!  SAME DAY Appointments for ACUTE CARE  Such as: Sprains, Injuries, cuts, abrasions, rashes, muscle pain, joint pain, back pain Colds, flu, sore throats, headache, allergies, cough, fever  Ear pain, sinus and eye infections Abdominal pain, nausea, vomiting, diarrhea, upset stomach Animal/insect bites  3 Easy Ways to Schedule: Walk-In Scheduling Call in scheduling Mychart Sign-up: [email or web address]

## 2017-02-05 NOTE — Progress Notes (Addendum)
HPI:  Acute visit for hot flashes: -The started during menopause many years ago -She has been seeing an integrative medicine doctor for this and has been on hormones for 15 years, she also is on a combination thyroid product and a number of supplements including vitamin D3, K2, probiotic, multivitamin, coenzyme Q10 and Omega's -She is frustrated that despite all this she has never had relief from her hot flashes -She is afraid to try any antidepressants for hot flashes as was on them in the past for depression and feels they caused mild cognitive issues -She is trying to take a more natural progesterone life and wonders if she really should be on all these warm and and vitamins and once my take on this -She is wondering if there are other options for hormones that may work better -No unexplained weight changes, fevers, malaise, skin rashes or any other symptoms reports that otherwise she is doing quite well -reports she really has been enjoying her grandchildren whom she keep several days per week -Her gynecologist retired and she wonders if she can do her GYN exam here -she wonders if she could have diabetes and this could be the cause of her hot flashes  ROS: See pertinent positives and negatives per HPI.  Past Medical History:  Diagnosis Date  . Abnormal stress test    2016, saw cards, nuclear scan planned  . Depression    seeing [name] - psychologist  . GERD (gastroesophageal reflux disease)   . Hyperlipemia   . Hypertension   . Melanoma ([place])    in 2016, removed; seeing Dr. [name]    Past Surgical History:  Procedure Laterality Date  . ABDOMINAL HYSTERECTOMY    . BREAST BIOPSY Right 2015  . TONSILLECTOMY  1959    Family History  Problem Relation Age of Onset  . Alcoholism Father   . Arthritis Mother   . Arthritis Sister   . Arthritis Father   . Breast cancer Unknown        Paternal cousin  . Heart disease Father   . Heart disease Mother   . Hypertension Father    . Hypertension Mother   . Mental illness Maternal Aunt   . Asthma Brother   . Healthy Son     Social History   Social History  . Marital status: Married    Spouse name: N/A  . Number of children: N/A  . Years of education: N/A   Social History Main Topics  . Smoking status: Former Smoker    Packs/day: 0.75    Years: 5.00    Types: Cigarettes    Quit date: 04/16/2007  . Smokeless tobacco: Never Used  . Alcohol use 0.0 oz/week     Comment: 1-2 drinks over the weekend, previously 1 drink nightly (martini)  . Drug use: No  . Sexual activity: Not Asked   Other Topics Concern  . None   Social History Narrative   Lives with husband in a 2 story home.  Has 2 sons.     Not currently working.  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    Education: some college.     Current Outpatient Prescriptions:  .  NATURE-THROID 65 MG tablet, Take 65 mg by mouth daily. , Disp: , Rfl: 2 .  NON FORMULARY, Testosterone cream-uses 0.93ml BID, Disp: , Rfl:  .  NON FORMULARY, Testosterone cream 0.5gm, Disp: , Rfl:  .  PROGESTERONE MICRONIZED PO, Take 100 mg by mouth daily. ,  Disp: , Rfl:  .  UNABLE TO FIND, Apply 1 application topically at bedtime. Med Name: Testosterone cream, Disp: , Rfl:   EXAM:  Vitals:   02/05/17 1348  BP: 128/82  Pulse: 75  Temp: 98.3 F (36.8 C)    Body mass index is 23.55 kg/m.  GENERAL: vitals reviewed and listed above, alert, oriented, appears well hydrated and in no acute distress  HEENT: atraumatic, conjunttiva clear, no obvious abnormalities on inspection of external nose and ears  NECK: no obvious masses on inspection  LUNGS: clear to auscultation bilaterally, no wheezes, rales or rhonchi, good air movement  CV: HRRR, no peripheral edema  MS: moves all extremities without noticeable abnormality  PSYCH: pleasant and cooperative, no obvious depression or anxiety  ASSESSMENT AND PLAN:  Discussed the following assessment and  plan: More than 50% of over 30 minutes spent in total in caring for this patient was spent face-to-face with the patient, counseling and/or coordinating care.    Hot flashes - Plan: Hemoglobin A1c, Ambulatory referral to Endocrinology  Hypothyroidism, unspecified type - Plan: Ambulatory referral to Endocrinology  Vitamin D deficiency - Plan: VITAMIN D 25 Hydroxy (Vit-D Deficiency, Fractures)  -hot flashes, seems refractory to treatment -She has a lot of questions about supplements for hot flashes and different types of compounded hormones and thyroid replacement for hot flashes -Suggested if she has not felt any better on all this, that simplifying may be a good route to take to see if she feels better -May also talked about possibly seeing an endocrinologist about her questions regarding hormones and different types of products, she really wants to do this and we placed a referral -She will consider tapering off some of her supplements -She agrees to schedule her physical exam -She is anxious about the possibility of diabetes causing her symptoms - we will check a hemoglobin A1c  -she reports a history of vitamin D deficiency and is on a high-dose of vitamin D from integrative doctor, she reports her vitamin D has not been rechecked since she has been on the high dose. We will recheck a vitamin D for her today -Patient advised to return or notify a doctor immediately if symptoms worsen or persist or new concerns arise.  Patient Instructions  BEFORE YOU LEAVE: -follow up: CPE in 1-2 months, come fasting  We have ordered labs or studies at this visit. It can take up to 1-2 weeks for results and processing. IF results require follow up or explanation, we will call you with instructions. Clinically stable results will be released to your [facility]. If you have not heard from [place] or cannot find your results in [facility] in 2 weeks please contact our office at [phone].  If you are not yet signed  up for [facility], please consider signing up.  -We placed a referral for you as discussed to the endocrinologist. It usually takes about 1-2 weeks to process and schedule this referral. If you have not heard from [place] regarding this appointment in 2 weeks please contact our office.  WE NOW OFFER   [facility] FAST TRACK!!!  SAME DAY Appointments for ACUTE CARE  Such as: Sprains, Injuries, cuts, abrasions, rashes, muscle pain, joint pain, back pain Colds, flu, sore throats, headache, allergies, cough, fever  Ear pain, sinus and eye infections Abdominal pain, nausea, vomiting, diarrhea, upset stomach Animal/insect bites  3 Easy Ways to Schedule: Walk-In Scheduling Call in scheduling Mychart Sign-up: [email or web address]  Colin Benton R., DO

## 2017-03-05 ENCOUNTER — Encounter: Payer: Self-pay | Admitting: Family Medicine

## 2017-03-16 ENCOUNTER — Ambulatory Visit (INDEPENDENT_AMBULATORY_CARE_PROVIDER_SITE_OTHER): Payer: BLUE CROSS/BLUE SHIELD | Admitting: Family Medicine

## 2017-03-16 ENCOUNTER — Encounter: Payer: Self-pay | Admitting: Family Medicine

## 2017-03-16 VITALS — BP 138/88 | HR 76 | Temp 98.0°F | Ht 64.0 in | Wt 139.0 lb

## 2017-03-16 DIAGNOSIS — J018 Other acute sinusitis: Secondary | ICD-10-CM | POA: Diagnosis not present

## 2017-03-16 MED ORDER — AZITHROMYCIN 250 MG PO TABS: ORAL_TABLET | ORAL | 0 refills | Status: DC

## 2017-03-16 NOTE — Patient Instructions (Signed)
WE NOW OFFER   [facility] FAST TRACK!!!  SAME DAY Appointments for ACUTE CARE  Such as: Sprains, Injuries, cuts, abrasions, rashes, muscle pain, joint pain, back pain Colds, flu, sore throats, headache, allergies, cough, fever  Ear pain, sinus and eye infections Abdominal pain, nausea, vomiting, diarrhea, upset stomach Animal/insect bites  3 Easy Ways to Schedule: Walk-In Scheduling Call in scheduling Mychart Sign-up: [email or web address]         

## 2017-03-16 NOTE — Progress Notes (Signed)
   Subjective:    Patient ID: Olivia Morrison, female    DOB: Oct 16, 1950, 66 y.o.   MRN: [id number]  HPI Here for 3 weeks of sinus pressure, ear pain, PND, and blowing yellow mucus from the nose. No cough. She had fever and a ST for 2 days at the beginning but these resolved. She is using a nasal rinse and Afrin.    Review of Systems  Constitutional: Negative.   HENT: Positive for congestion, ear pain, postnasal drip, sinus pain and sinus pressure. Negative for sore throat.   Eyes: Negative.   Respiratory: Negative.        Objective:   Physical Exam  Constitutional: She appears well-developed and well-nourished.  HENT:  Right Ear: External ear normal.  Left Ear: External ear normal.  Nose: Nose normal.  Mouth/Throat: Oropharynx is clear and moist.  Eyes: Conjunctivae are normal.  Neck: No thyromegaly present.  Cardiovascular: Normal rate, regular rhythm, normal heart sounds and intact distal pulses.   Pulmonary/Chest: Effort normal and breath sounds normal. No respiratory distress. She has no wheezes. She has no rales.  Lymphadenopathy:    She has no cervical adenopathy.          Assessment & Plan:  Sinusitis, treat with Mucinex and a Zpack.  Alysia Penna, MD

## 2017-03-19 LAB — HM MAMMOGRAPHY

## 2017-03-23 ENCOUNTER — Encounter: Payer: BLUE CROSS/BLUE SHIELD | Admitting: Family Medicine

## 2017-03-27 NOTE — Progress Notes (Signed)
HPI:  Here for CPE:  -Concerns and/or follow up today:   L hip "bursitis": -reports occ L lat hip pain chronically -used to see chiropractor -no weakness, numbness  Hypothyroidism: -seeing integrative specialist  Hyperglycemia: -mild prediabetes range hgba1c -lifestyle recs advised  Hot flashes: -seeing endocrinologist -wants to consider other options as nothing has worked -interferes with her sleep - requests klonopin for this  -Diet: variety of foods, balance and well rounded, larger portion sizes  -Exercise: no regular exercise  -Taking folic acid, vitamin D or calcium: no  -Diabetes and Dyslipidemia Screening: fasting for labs  -Hx of HTN: no  -Vaccines: refuses all, hx remote reaction to tetanus vaccine  -pap history: reports all normal in the past, reports s/p hysterectomy  -FDLMP: n/a  -sexual activity: yes, female partner, no new partners  -wants STI testing (Hep C if born 40-65): no  -FH breast, colon or ovarian ca: see FH Last mammogram: 03/2017 birads 1 Last colon cancer screening: reports UTD with Dr. Collene Mares, reports told was normal and to repeat in 10 years  -Alcohol, Tobacco, drug use: see social history  Review of Systems - no fevers, unintentional weight loss, vision loss, hearing loss, chest pain, sob, hemoptysis, melena, hematochezia, hematuria, genital discharge, changing or concerning skin lesions, bleeding, bruising, loc, thoughts of self harm or SI  Past Medical History:  Diagnosis Date  . Abnormal stress test    2016, saw cards, nuclear scan planned  . Depression    seeing [name] - psychologist  . GERD (gastroesophageal reflux disease)   . Hyperlipemia   . Hypertension   . Melanoma ([place])    in 2016, removed; seeing Dr. [name]    Past Surgical History:  Procedure Laterality Date  . ABDOMINAL HYSTERECTOMY    . BREAST BIOPSY Right 2015  . TONSILLECTOMY  1959    Family History  Problem Relation Age of Onset  . Alcoholism  Father   . Arthritis Mother   . Arthritis Sister   . Arthritis Father   . Breast cancer Unknown        Paternal cousin  . Heart disease Father   . Heart disease Mother   . Hypertension Father   . Hypertension Mother   . Mental illness Maternal Aunt   . Asthma Brother   . Healthy Son     Social History   Social History  . Marital status: Married    Spouse name: N/A  . Number of children: N/A  . Years of education: N/A   Social History Main Topics  . Smoking status: Former Smoker    Packs/day: 0.75    Years: 5.00    Types: Cigarettes    Quit date: 04/16/2007  . Smokeless tobacco: Never Used  . Alcohol use 0.0 oz/week     Comment: 1-2 drinks over the weekend, previously 1 drink nightly (martini)  . Drug use: No  . Sexual activity: Not Asked   Other Topics Concern  . None   Social History Narrative   Lives with husband in a 2 story home.  Has 2 sons.     Not currently working.  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    Education: some college.    No current outpatient prescriptions on file.  EXAM:  Vitals:   03/30/17 1056  BP: 110/78  Pulse: 79  Temp: 97.7 F (36.5 C)    GENERAL: vitals reviewed and listed below, alert, oriented, appears well hydrated and in no acute  distress  HEENT: head atraumatic, PERRLA, normal appearance of eyes, ears, nose and mouth. moist mucus membranes.  NECK: supple, no masses or lymphadenopathy  LUNGS: clear to auscultation bilaterally, no rales, rhonchi or wheeze  CV: HRRR, no peripheral edema or cyanosis, normal pedal pulses  ABDOMEN: bowel sounds normal, soft, non tender to palpation, no masses, no rebound or guarding  BREAST: normal appearance - no skin lesions or discharge noted on inspection of both breasts, on palpation of both breast and axillary region no suspicious lesions appreciated today  GU: normal appearance of external genitalia - no lesions or masses appreciated, normal appearing vaginal mucosa  , pap obtained  RECTAL: deferred  SKIN: declined - does with dermatologist  MS: normal gait, moves all extremities normally, L ant inominate with leg length discrepancy - resolved after treatment  NEURO: normal gait, speech and thought processing grossly intact, muscle tone grossly intact throughout  PSYCH: normal affect, pleasant and cooperative  ASSESSMENT AND PLAN:  Discussed the following assessment and plan:  Encounter for preventative adult health care examination -Discussed and advised all [place] preventive services health task force level A and B recommendations for age, sex and risks.  -Advised at least 150 minutes of exercise per week and a healthy diet with avoidance of (less then 1 serving per week) processed foods, white starches, red meat, fast foods and sweets and consisting of: * 5-9 servings of fresh fruits and vegetables (not corn or potatoes) *nuts and seeds, beans *olives and olive oil *lean meats such as fish and white chicken  *whole grains  -labs, studies and vaccines per orders this encounter  Hyperlipidemia, unspecified hyperlipidemia type - Plan: Lipid panel  Hypothyroidism, unspecified type  Melanoma of skin ([place]) -sees dermatologist  Hot flashes -again discussed tx options and risks and she is going to discuss with her endocrinologist and consider effexor, will let me know  Somatic dysfunction of pelvis region Left hip pain PROCEDURE NOTE : OSTEOPATHIC TREATMENT The decision today to treat with gentle Osteopathic Manipulative Therapy  (OMT) was based on physical exam findings and patient wishes. Verbal consent was  obtained after after explanation of risks and benefits. No Cervical HVLA manipulation was performed. After consent was obtained, treatment was  performed as below:      Regions treated: pelvic     Techniques used: ME The patient tolerated the treatment well and reported Improved  symptoms following treatment  today. Follow up treatment was advised in: 1-2 weeks   Encounter for HCV screening test for high risk patient - Plan: Hepatitis C antibody     Orders Placed This Encounter  Procedures  . Lipid panel  . Hepatitis C antibody    Patient advised to return to clinic immediately if symptoms worsen or persist or new concerns.  Patient Instructions  BEFORE YOU LEAVE: -follow up:  OMT (osteopathic treatment) as needed - 30 minute, last visit of day -follow up yearly for CPE -labs  We have ordered labs or studies at this visit. It can take up to 1-2 weeks for results and processing. IF results require follow up or explanation, we will call you with instructions. Clinically stable results will be released to your [facility]. If you have not heard from [place] or cannot find your results in [facility] in 2 weeks please contact our office at [phone].  If you are not yet signed up for [facility], please consider signing up.   Health Maintenance for Postmenopausal Women Menopause is a normal process in which your  reproductive ability comes to an end. This process happens gradually over a span of months to years, usually between the ages of 23 and 80. Menopause is complete when you have missed 12 consecutive menstrual periods. It is important to talk with your health care provider about some of the most common conditions that affect postmenopausal women, such as heart disease, cancer, and bone loss (osteoporosis). Adopting a healthy lifestyle and getting preventive care can help to promote your health and wellness. Those actions can also lower your chances of developing some of these common conditions. What should I know about menopause? During menopause, you may experience a number of symptoms, such as:  Moderate-to-severe hot flashes.  Night sweats.  Decrease in sex drive.  Mood swings.  Headaches.  Tiredness.  Irritability.  Memory problems.  Insomnia.  Choosing to treat or not to treat  menopausal changes is an individual decision that you make with your health care provider. What should I know about hormone replacement therapy and supplements? Hormone therapy products are effective for treating symptoms that are associated with menopause, such as hot flashes and night sweats. Hormone replacement carries certain risks, especially as you become older. If you are thinking about using estrogen or estrogen with progestin treatments, discuss the benefits and risks with your health care provider. What should I know about heart disease and stroke? Heart disease, heart attack, and stroke become more likely as you age. This may be due, in part, to the hormonal changes that your body experiences during menopause. These can affect how your body processes dietary fats, triglycerides, and cholesterol. Heart attack and stroke are both medical emergencies. There are many things that you can do to help prevent heart disease and stroke:  Have your blood pressure checked at least every 1-2 years. High blood pressure causes heart disease and increases the risk of stroke.  If you are 74-32 years old, ask your health care provider if you should take aspirin to prevent a heart attack or a stroke.  Do not use any tobacco products, including cigarettes, chewing tobacco, or electronic cigarettes. If you need help quitting, ask your health care provider.  It is important to eat a healthy diet and maintain a healthy weight. ? Be sure to include plenty of vegetables, fruits, low-fat dairy products, and lean protein. ? Avoid eating foods that are high in solid fats, added sugars, or salt (sodium).  Get regular exercise. This is one of the most important things that you can do for your health. ? Try to exercise for at least 150 minutes each week. The type of exercise that you do should increase your heart rate and make you sweat. This is known as moderate-intensity exercise. ? Try to do strengthening  exercises at least twice each week. Do these in addition to the moderate-intensity exercise.  Know your numbers.Ask your health care provider to check your cholesterol and your blood glucose. Continue to have your blood tested as directed by your health care provider.  What should I know about cancer screening? There are several types of cancer. Take the following steps to reduce your risk and to catch any cancer development as early as possible. Breast Cancer  Practice breast self-awareness. ? This means understanding how your breasts normally appear and feel. ? It also means doing regular breast self-exams. Let your health care provider know about any changes, no matter how small.  If you are 61 or older, have a clinician do a breast exam (clinical breast exam  or CBE) every year. Depending on your age, family history, and medical history, it may be recommended that you also have a yearly breast X-ray (mammogram).  If you have a family history of breast cancer, talk with your health care provider about genetic screening.  If you are at high risk for breast cancer, talk with your health care provider about having an MRI and a mammogram every year.  Breast cancer (BRCA) gene test is recommended for women who have family members with BRCA-related cancers. Results of the assessment will determine the need for genetic counseling and BRCA1 and for BRCA2 testing. BRCA-related cancers include these types: ? Breast. This occurs in males or females. ? Ovarian. ? Tubal. This may also be called fallopian tube cancer. ? Cancer of the abdominal or pelvic lining (peritoneal cancer). ? Prostate. ? Pancreatic.  Cervical, Uterine, and Ovarian Cancer Your health care provider may recommend that you be screened regularly for cancer of the pelvic organs. These include your ovaries, uterus, and vagina. This screening involves a pelvic exam, which includes checking for microscopic changes to the surface of  your cervix (Pap test).  For women ages 21-65, health care providers may recommend a pelvic exam and a Pap test every three years. For women ages 17-65, they may recommend the Pap test and pelvic exam, combined with testing for human papilloma virus (HPV), every five years. Some types of HPV increase your risk of cervical cancer. Testing for HPV may also be done on women of any age who have unclear Pap test results.  Other health care providers may not recommend any screening for nonpregnant women who are considered low risk for pelvic cancer and have no symptoms. Ask your health care provider if a screening pelvic exam is right for you.  If you have had past treatment for cervical cancer or a condition that could lead to cancer, you need Pap tests and screening for cancer for at least 20 years after your treatment. If Pap tests have been discontinued for you, your risk factors (such as having a new sexual partner) need to be reassessed to determine if you should start having screenings again. Some women have medical problems that increase the chance of getting cervical cancer. In these cases, your health care provider may recommend that you have screening and Pap tests more often.  If you have a family history of uterine cancer or ovarian cancer, talk with your health care provider about genetic screening.  If you have vaginal bleeding after reaching menopause, tell your health care provider.  There are currently no reliable tests available to screen for ovarian cancer.  Lung Cancer Lung cancer screening is recommended for adults 60-17 years old who are at high risk for lung cancer because of a history of smoking. A yearly low-dose CT scan of the lungs is recommended if you:  Currently smoke.  Have a history of at least 30 pack-years of smoking and you currently smoke or have quit within the past 15 years. A pack-year is smoking an average of one pack of cigarettes per day for one year.  Yearly  screening should:  Continue until it has been 15 years since you quit.  Stop if you develop a health problem that would prevent you from having lung cancer treatment.  Colorectal Cancer  This type of cancer can be detected and can often be prevented.  Routine colorectal cancer screening usually begins at age 17 and continues through age 77.  If you have risk  factors for colon cancer, your health care provider may recommend that you be screened at an earlier age.  If you have a family history of colorectal cancer, talk with your health care provider about genetic screening.  Your health care provider may also recommend using home test kits to check for hidden blood in your stool.  A small camera at the end of a tube can be used to examine your colon directly (sigmoidoscopy or colonoscopy). This is done to check for the earliest forms of colorectal cancer.  Direct examination of the colon should be repeated every 5-10 years until age 61. However, if early forms of precancerous polyps or small growths are found or if you have a family history or genetic risk for colorectal cancer, you may need to be screened more often.  Skin Cancer  Check your skin from head to toe regularly.  Monitor any moles. Be sure to tell your health care provider: ? About any new moles or changes in moles, especially if there is a change in a mole's shape or color. ? If you have a mole that is larger than the size of a pencil eraser.  If any of your family members has a history of skin cancer, especially at a young age, talk with your health care provider about genetic screening.  Always use sunscreen. Apply sunscreen liberally and repeatedly throughout the day.  Whenever you are outside, protect yourself by wearing long sleeves, pants, a wide-brimmed hat, and sunglasses.  What should I know about osteoporosis? Osteoporosis is a condition in which bone destruction happens more quickly than new bone creation.  After menopause, you may be at an increased risk for osteoporosis. To help prevent osteoporosis or the bone fractures that can happen because of osteoporosis, the following is recommended:  If you are 60-55 years old, get at least 1,000 mg of calcium and at least 600 mg of vitamin D per day.  If you are older than age 65 but younger than age 62, get at least 1,200 mg of calcium and at least 600 mg of vitamin D per day.  If you are older than age 28, get at least 1,200 mg of calcium and at least 800 mg of vitamin D per day.  Smoking and excessive alcohol intake increase the risk of osteoporosis. Eat foods that are rich in calcium and vitamin D, and do weight-bearing exercises several times each week as directed by your health care provider. What should I know about how menopause affects my mental health? Depression may occur at any age, but it is more common as you become older. Common symptoms of depression include:  Low or sad mood.  Changes in sleep patterns.  Changes in appetite or eating patterns.  Feeling an overall lack of motivation or enjoyment of activities that you previously enjoyed.  Frequent crying spells.  Talk with your health care provider if you think that you are experiencing depression. What should I know about immunizations? It is important that you get and maintain your immunizations. These include:  Tetanus, diphtheria, and pertussis (Tdap) booster vaccine.  Influenza every year before the flu season begins.  Pneumonia vaccine.  Shingles vaccine.  Your health care provider may also recommend other immunizations. This information is not intended to replace advice given to you by your health care provider. Make sure you discuss any questions you have with your health care provider. Document Released: 07/25/2005 Document Revised: 12/21/2015 Document Reviewed: 03/06/2015 Elsevier Interactive Patient Education  2018 [employer]  WE NOW OFFER   [facility] FAST TRACK!!!  SAME DAY Appointments for ACUTE CARE  Such as: Sprains, Injuries, cuts, abrasions, rashes, muscle pain, joint pain, back pain Colds, flu, sore throats, headache, allergies, cough, fever  Ear pain, sinus and eye infections Abdominal pain, nausea, vomiting, diarrhea, upset stomach Animal/insect bites  3 Easy Ways to Schedule: Walk-In Scheduling Call in scheduling Mychart Sign-up: [email or web address]                 No Follow-up on file.  Colin Benton R., DO

## 2017-03-30 ENCOUNTER — Encounter: Payer: Self-pay | Admitting: Family Medicine

## 2017-03-30 ENCOUNTER — Other Ambulatory Visit (HOSPITAL_COMMUNITY)
Admission: RE | Admit: 2017-03-30 | Discharge: 2017-03-30 | Disposition: A | Discharge status: Home / Self Care | Payer: BLUE CROSS/BLUE SHIELD | Source: Ambulatory Visit | Attending: Family Medicine | Admitting: Family Medicine

## 2017-03-30 ENCOUNTER — Ambulatory Visit (INDEPENDENT_AMBULATORY_CARE_PROVIDER_SITE_OTHER): Payer: BLUE CROSS/BLUE SHIELD | Admitting: Family Medicine

## 2017-03-30 ENCOUNTER — Ambulatory Visit: Payer: BLUE CROSS/BLUE SHIELD | Admitting: Internal Medicine

## 2017-03-30 VITALS — BP 110/78 | HR 79 | Temp 97.7°F | Ht 64.0 in | Wt 140.8 lb

## 2017-03-30 DIAGNOSIS — Z0000 Encounter for general adult medical examination without abnormal findings: Secondary | ICD-10-CM

## 2017-03-30 DIAGNOSIS — E785 Hyperlipidemia, unspecified: Secondary | ICD-10-CM

## 2017-03-30 DIAGNOSIS — Z1159 Encounter for screening for other viral diseases: Secondary | ICD-10-CM | POA: Insufficient documentation

## 2017-03-30 DIAGNOSIS — Z124 Encounter for screening for malignant neoplasm of cervix: Secondary | ICD-10-CM | POA: Insufficient documentation

## 2017-03-30 DIAGNOSIS — E039 Hypothyroidism, unspecified: Secondary | ICD-10-CM

## 2017-03-30 DIAGNOSIS — C439 Malignant melanoma of skin, unspecified: Secondary | ICD-10-CM | POA: Diagnosis not present

## 2017-03-30 DIAGNOSIS — M9905 Segmental and somatic dysfunction of pelvic region: Secondary | ICD-10-CM

## 2017-03-30 DIAGNOSIS — Z9189 Other specified personal risk factors, not elsewhere classified: Secondary | ICD-10-CM

## 2017-03-30 DIAGNOSIS — M25552 Pain in left hip: Secondary | ICD-10-CM | POA: Diagnosis not present

## 2017-03-30 LAB — LIPID PANEL
Cholesterol: 190 mg/dL (ref 0–200)
HDL: 71.2 mg/dL (ref >39.00)
LDL Cholesterol: 96 mg/dL (ref 0–99)
NonHDL: 118.7
TRIGLYCERIDES: 116 mg/dL (ref 0.0–149.0)
Total CHOL/HDL Ratio: 3
VLDL: 23.2 mg/dL (ref 0.0–40.0)

## 2017-03-30 NOTE — Patient Instructions (Signed)
BEFORE YOU LEAVE: -follow up:  OMT (osteopathic treatment) as needed - 30 minute, last visit of day -follow up yearly for CPE -labs  We have ordered labs or studies at this visit. It can take up to 1-2 weeks for results and processing. IF results require follow up or explanation, we will call you with instructions. Clinically stable results will be released to your [facility]. If you have not heard from [place] or cannot find your results in [facility] in 2 weeks please contact our office at [phone].  If you are not yet signed up for [facility], please consider signing up.   Health Maintenance for Postmenopausal Women Menopause is a normal process in which your reproductive ability comes to an end. This process happens gradually over a span of months to years, usually between the ages of 50 and 63. Menopause is complete when you have missed 12 consecutive menstrual periods. It is important to talk with your health care provider about some of the most common conditions that affect postmenopausal women, such as heart disease, cancer, and bone loss (osteoporosis). Adopting a healthy lifestyle and getting preventive care can help to promote your health and wellness. Those actions can also lower your chances of developing some of these common conditions. What should I know about menopause? During menopause, you may experience a number of symptoms, such as:  Moderate-to-severe hot flashes.  Night sweats.  Decrease in sex drive.  Mood swings.  Headaches.  Tiredness.  Irritability.  Memory problems.  Insomnia.  Choosing to treat or not to treat menopausal changes is an individual decision that you make with your health care provider. What should I know about hormone replacement therapy and supplements? Hormone therapy products are effective for treating symptoms that are associated with menopause, such as hot flashes and night sweats. Hormone replacement carries certain risks, especially as you  become older. If you are thinking about using estrogen or estrogen with progestin treatments, discuss the benefits and risks with your health care provider. What should I know about heart disease and stroke? Heart disease, heart attack, and stroke become more likely as you age. This may be due, in part, to the hormonal changes that your body experiences during menopause. These can affect how your body processes dietary fats, triglycerides, and cholesterol. Heart attack and stroke are both medical emergencies. There are many things that you can do to help prevent heart disease and stroke:  Have your blood pressure checked at least every 1-2 years. High blood pressure causes heart disease and increases the risk of stroke.  If you are 22-63 years old, ask your health care provider if you should take aspirin to prevent a heart attack or a stroke.  Do not use any tobacco products, including cigarettes, chewing tobacco, or electronic cigarettes. If you need help quitting, ask your health care provider.  It is important to eat a healthy diet and maintain a healthy weight. ? Be sure to include plenty of vegetables, fruits, low-fat dairy products, and lean protein. ? Avoid eating foods that are high in solid fats, added sugars, or salt (sodium).  Get regular exercise. This is one of the most important things that you can do for your health. ? Try to exercise for at least 150 minutes each week. The type of exercise that you do should increase your heart rate and make you sweat. This is known as moderate-intensity exercise. ? Try to do strengthening exercises at least twice each week. Do these in addition to the  moderate-intensity exercise.  Know your numbers.Ask your health care provider to check your cholesterol and your blood glucose. Continue to have your blood tested as directed by your health care provider.  What should I know about cancer screening? There are several types of cancer. Take the  following steps to reduce your risk and to catch any cancer development as early as possible. Breast Cancer  Practice breast self-awareness. ? This means understanding how your breasts normally appear and feel. ? It also means doing regular breast self-exams. Let your health care provider know about any changes, no matter how small.  If you are 10 or older, have a clinician do a breast exam (clinical breast exam or CBE) every year. Depending on your age, family history, and medical history, it may be recommended that you also have a yearly breast X-ray (mammogram).  If you have a family history of breast cancer, talk with your health care provider about genetic screening.  If you are at high risk for breast cancer, talk with your health care provider about having an MRI and a mammogram every year.  Breast cancer (BRCA) gene test is recommended for women who have family members with BRCA-related cancers. Results of the assessment will determine the need for genetic counseling and BRCA1 and for BRCA2 testing. BRCA-related cancers include these types: ? Breast. This occurs in males or females. ? Ovarian. ? Tubal. This may also be called fallopian tube cancer. ? Cancer of the abdominal or pelvic lining (peritoneal cancer). ? Prostate. ? Pancreatic.  Cervical, Uterine, and Ovarian Cancer Your health care provider may recommend that you be screened regularly for cancer of the pelvic organs. These include your ovaries, uterus, and vagina. This screening involves a pelvic exam, which includes checking for microscopic changes to the surface of your cervix (Pap test).  For women ages 21-65, health care providers may recommend a pelvic exam and a Pap test every three years. For women ages 62-65, they may recommend the Pap test and pelvic exam, combined with testing for human papilloma virus (HPV), every five years. Some types of HPV increase your risk of cervical cancer. Testing for HPV may also be done  on women of any age who have unclear Pap test results.  Other health care providers may not recommend any screening for nonpregnant women who are considered low risk for pelvic cancer and have no symptoms. Ask your health care provider if a screening pelvic exam is right for you.  If you have had past treatment for cervical cancer or a condition that could lead to cancer, you need Pap tests and screening for cancer for at least 20 years after your treatment. If Pap tests have been discontinued for you, your risk factors (such as having a new sexual partner) need to be reassessed to determine if you should start having screenings again. Some women have medical problems that increase the chance of getting cervical cancer. In these cases, your health care provider may recommend that you have screening and Pap tests more often.  If you have a family history of uterine cancer or ovarian cancer, talk with your health care provider about genetic screening.  If you have vaginal bleeding after reaching menopause, tell your health care provider.  There are currently no reliable tests available to screen for ovarian cancer.  Lung Cancer Lung cancer screening is recommended for adults 33-52 years old who are at high risk for lung cancer because of a history of smoking. A yearly low-dose CT  scan of the lungs is recommended if you:  Currently smoke.  Have a history of at least 30 pack-years of smoking and you currently smoke or have quit within the past 15 years. A pack-year is smoking an average of one pack of cigarettes per day for one year.  Yearly screening should:  Continue until it has been 15 years since you quit.  Stop if you develop a health problem that would prevent you from having lung cancer treatment.  Colorectal Cancer  This type of cancer can be detected and can often be prevented.  Routine colorectal cancer screening usually begins at age 26 and continues through age 43.  If you  have risk factors for colon cancer, your health care provider may recommend that you be screened at an earlier age.  If you have a family history of colorectal cancer, talk with your health care provider about genetic screening.  Your health care provider may also recommend using home test kits to check for hidden blood in your stool.  A small camera at the end of a tube can be used to examine your colon directly (sigmoidoscopy or colonoscopy). This is done to check for the earliest forms of colorectal cancer.  Direct examination of the colon should be repeated every 5-10 years until age 23. However, if early forms of precancerous polyps or small growths are found or if you have a family history or genetic risk for colorectal cancer, you may need to be screened more often.  Skin Cancer  Check your skin from head to toe regularly.  Monitor any moles. Be sure to tell your health care provider: ? About any new moles or changes in moles, especially if there is a change in a mole's shape or color. ? If you have a mole that is larger than the size of a pencil eraser.  If any of your family members has a history of skin cancer, especially at a young age, talk with your health care provider about genetic screening.  Always use sunscreen. Apply sunscreen liberally and repeatedly throughout the day.  Whenever you are outside, protect yourself by wearing long sleeves, pants, a wide-brimmed hat, and sunglasses.  What should I know about osteoporosis? Osteoporosis is a condition in which bone destruction happens more quickly than new bone creation. After menopause, you may be at an increased risk for osteoporosis. To help prevent osteoporosis or the bone fractures that can happen because of osteoporosis, the following is recommended:  If you are 9-109 years old, get at least 1,000 mg of calcium and at least 600 mg of vitamin D per day.  If you are older than age 68 but younger than age 9, get at  least 1,200 mg of calcium and at least 600 mg of vitamin D per day.  If you are older than age 54, get at least 1,200 mg of calcium and at least 800 mg of vitamin D per day.  Smoking and excessive alcohol intake increase the risk of osteoporosis. Eat foods that are rich in calcium and vitamin D, and do weight-bearing exercises several times each week as directed by your health care provider. What should I know about how menopause affects my mental health? Depression may occur at any age, but it is more common as you become older. Common symptoms of depression include:  Low or sad mood.  Changes in sleep patterns.  Changes in appetite or eating patterns.  Feeling an overall lack of motivation or enjoyment of activities  that you previously enjoyed.  Frequent crying spells.  Talk with your health care provider if you think that you are experiencing depression. What should I know about immunizations? It is important that you get and maintain your immunizations. These include:  Tetanus, diphtheria, and pertussis (Tdap) booster vaccine.  Influenza every year before the flu season begins.  Pneumonia vaccine.  Shingles vaccine.  Your health care provider may also recommend other immunizations. This information is not intended to replace advice given to you by your health care provider. Make sure you discuss any questions you have with your health care provider. Document Released: 07/25/2005 Document Revised: 12/21/2015 Document Reviewed: 03/06/2015 Elsevier Interactive Patient Education  2018 [employer] NOW OFFER   [facility] FAST TRACK!!!  SAME DAY Appointments for ACUTE CARE  Such as: Sprains, Injuries, cuts, abrasions, rashes, muscle pain, joint pain, back pain Colds, flu, sore throats, headache, allergies, cough, fever  Ear pain, sinus and eye infections Abdominal pain, nausea, vomiting, diarrhea, upset stomach Animal/insect bites  3 Easy Ways to  Schedule: Walk-In Scheduling Call in scheduling Mychart Sign-up: [email or web address]

## 2017-03-31 LAB — HEPATITIS C ANTIBODY
HEP C AB: NONREACTIVE
SIGNAL TO CUT-OFF: 0 (ref <1.00)

## 2017-04-02 LAB — CYTOLOGY - PAP
DIAGNOSIS: NEGATIVE
HPV: NOT DETECTED

## 2017-04-09 ENCOUNTER — Ambulatory Visit (INDEPENDENT_AMBULATORY_CARE_PROVIDER_SITE_OTHER): Payer: BLUE CROSS/BLUE SHIELD | Admitting: Family Medicine

## 2017-04-09 ENCOUNTER — Encounter: Payer: Self-pay | Admitting: Family Medicine

## 2017-04-09 VITALS — BP 100/68 | HR 74 | Temp 98.1°F | Ht 64.0 in

## 2017-04-09 DIAGNOSIS — M9901 Segmental and somatic dysfunction of cervical region: Secondary | ICD-10-CM | POA: Diagnosis not present

## 2017-04-09 DIAGNOSIS — F32A Depression, unspecified: Secondary | ICD-10-CM

## 2017-04-09 DIAGNOSIS — M9900 Segmental and somatic dysfunction of head region: Secondary | ICD-10-CM

## 2017-04-09 DIAGNOSIS — M542 Cervicalgia: Secondary | ICD-10-CM | POA: Diagnosis not present

## 2017-04-09 DIAGNOSIS — R232 Flushing: Secondary | ICD-10-CM

## 2017-04-09 DIAGNOSIS — M9902 Segmental and somatic dysfunction of thoracic region: Secondary | ICD-10-CM

## 2017-04-09 DIAGNOSIS — M9906 Segmental and somatic dysfunction of lower extremity: Secondary | ICD-10-CM

## 2017-04-09 DIAGNOSIS — M9905 Segmental and somatic dysfunction of pelvic region: Secondary | ICD-10-CM

## 2017-04-09 DIAGNOSIS — M9903 Segmental and somatic dysfunction of lumbar region: Secondary | ICD-10-CM | POA: Diagnosis not present

## 2017-04-09 DIAGNOSIS — F419 Anxiety disorder, unspecified: Secondary | ICD-10-CM | POA: Diagnosis not present

## 2017-04-09 DIAGNOSIS — M7632 Iliotibial band syndrome, left leg: Secondary | ICD-10-CM

## 2017-04-09 DIAGNOSIS — M25551 Pain in right hip: Secondary | ICD-10-CM

## 2017-04-09 DIAGNOSIS — M25552 Pain in left hip: Secondary | ICD-10-CM

## 2017-04-09 DIAGNOSIS — F329 Major depressive disorder, single episode, unspecified: Secondary | ICD-10-CM

## 2017-04-09 MED ORDER — VENLAFAXINE HCL ER 37.5 MG PO CP24: 37.5000 mg | ORAL_CAPSULE | Freq: Every day | ORAL | 1 refills | Status: DC

## 2017-04-09 NOTE — Progress Notes (Signed)
HPI:  Here for an acute visit for left lateral hip pain. This is a chronic issue that has been going on for many years. She has seen a chiropractor in the past. She went to try osteopathic treatments for this. She reports for at least 10 years she will have some pain at times in the bilateral lateral hips. Sometimes she feels clicking on the right side and. The pain tends to be worse on the left side. She has had an injection in the past for trochanteric bursitis. It did not really help much. She sometimes gets a little bit of pain in the right mid back. She also occasionally has pain in the neck. None of these are significantly bothersome to her, but she did want to try some treatments for this today.  Note: after a lengthy visit, she mentioned that she feels like she is having worsening anxiety and maybe some mild depression. She has some stressors in her life, but overall feels very blessed unfortunate. She has a history of depression. She is going through menopause and has hot flashes. She would like to try to start Effexor. We have talked about this in the past. She also agrees to get plugged back in with her counselor. Denies thoughts of self-harm or suicide ideation.  ROS: See pertinent positives and negatives per HPI.  Past Medical History:  Diagnosis Date  . Abnormal stress test    2016, saw cards, nuclear scan planned  . Depression    seeing [name] - psychologist  . GERD (gastroesophageal reflux disease)   . Hyperlipemia   . Hypertension   . Melanoma ([place])    in 2016, removed; seeing Dr. [name]    Past Surgical History:  Procedure Laterality Date  . ABDOMINAL HYSTERECTOMY    . BREAST BIOPSY Right 2015  . TONSILLECTOMY  1959    Family History  Problem Relation Age of Onset  . Alcoholism Father   . Arthritis Mother   . Arthritis Sister   . Arthritis Father   . Breast cancer Unknown        Paternal cousin  . Heart disease Father   . Heart disease Mother   .  Hypertension Father   . Hypertension Mother   . Mental illness Maternal Aunt   . Asthma Brother   . Healthy Son     Social History   Social History  . Marital status: Married    Spouse name: N/A  . Number of children: N/A  . Years of education: N/A   Social History Main Topics  . Smoking status: Former Smoker    Packs/day: 0.75    Years: 5.00    Types: Cigarettes    Quit date: 04/16/2007  . Smokeless tobacco: Never Used  . Alcohol use 0.0 oz/week     Comment: 1-2 drinks over the weekend, previously 1 drink nightly (martini)  . Drug use: No  . Sexual activity: Not Asked   Other Topics Concern  . None   Social History Narrative   Lives with husband in a 2 story home.  Has 2 sons.     Not currently working.  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    Education: some college.     Current Outpatient Prescriptions:  .  venlafaxine XR (EFFEXOR XR) 37.5 MG 24 hr capsule, Take 1 capsule (37.5 mg total) by mouth daily with breakfast., Disp: 30 capsule, Rfl: 1  EXAM:  Vitals:   04/09/17 1616  BP: 100/68  Pulse: 74  Temp: 98.1 F (36.7 C)    There is no height or weight on file to calculate BMI.  GENERAL: vitals reviewed and listed above, alert, oriented, appears well hydrated and in no acute distress  HEENT: atraumatic, conjunttiva clear, no obvious abnormalities on inspection of external nose and ears  NECK: no obvious masses on inspection  LUNGS: clear to auscultation bilaterally, no wheezes, rales or rhonchi, good air movement  CV: HRRR, no peripheral edema  MS: moves all extremities without noticeable abnormality, understanding inspection she does have a head anterior position with her ears about 1 inch anterior to the shoulders, T3-5 are rotated right side bent left, L5 is extended rotated left side bent left, she has a right anterior innominate, right tib-fib externally rotated, she has a lateral IT band tender point on the left, she has a right C3  posterior cervical tender point, OA is rotated right, thoracic and that is rotated left, she has a right sidebending rotation screening no basilar strain, negative Spurling, negative vertebral artery insufficiency testing, normal gait, normal functioning all extremities, no tenderness over the trochanteric bursitis, she does have a tight IT band on both sides  PSYCH: pleasant and cooperative, no obvious depression or anxiety  ASSESSMENT AND PLAN:  Discussed the following assessment and plan:  Pain of both hip joints  Neck pain  Anxiety and depression  Hot flashes  Somatic dysfunction of head region  Somatic dysfunction of cervical region  Somatic dysfunction of thoracic region  Somatic dysfunction of lumbar region  Somatic dysfunction of pelvis region  Somatic dysfunction of lower extremity  It band syndrome, left  -She has a lot going on today, she wanted to do osteopathic treatments for the musculoskeletal issues - see notes below, tolerated well, improvement in left lateral hip pain after treatment - also giving her some exercises to do at home for the hip and IT band syndrome -Also talked about proper posture to help her neck and back issues -she mentioned that emotionally she is at the end of a very long appointment and I did offer her appointment tomorrow to discuss further, she preferred to mainly get started on Effexor (after discussion risks benefits)which she had been thinking about for some time and follow-up if any worsening and in 1 month, denies any severe symptoms -She agrees to see her psychologist promptly and agrees occult to call them tomorrow -Patient advised to return or notify a doctor immediately if symptoms worsen or persist or new concerns arise.  PROCEDURE NOTE : OSTEOPATHIC TREATMENT The decision today to treat with gentle Osteopathic Manipulative Therapy  (OMT) was based on physical exam findings and patient wishes. Verbal consent was  obtained  after after explanation of risks and benefits. No Cervical HVLA manipulation was performed. After consent was obtained, treatment was  performed as below:      Regions treated: Head, cervical, thoracic, lumbar, pelvis, lower extremity     Techniques used: counterstrain, myofascial release, muscle energy, cranial The patient tolerated the treatment well and reported Improved  symptoms following treatment today. Follow up treatment was advised in: 1-2 weeks   Patient Instructions  BEFORE YOU LEAVE: -IT band exercises -follow up: 1)OMT in 1- weeks 2) follow up anxiety and depression 4 weeks  Call to set up counseling.  Effexor once daily.  Hang in there! I hope you are feeling better soon. Follow up promptly if any worsening or concerns.   Colin Benton R., DO

## 2017-04-09 NOTE — Patient Instructions (Signed)
BEFORE YOU LEAVE: -IT band exercises -follow up: 1)OMT in 1- weeks 2) follow up anxiety and depression 4 weeks  Call to set up counseling.  Effexor once daily.  Hang in there! I hope you are feeling better soon. Follow up promptly if any worsening or concerns.

## 2017-04-15 NOTE — Progress Notes (Signed)
HPI:  Follow up several issues:  L>R lateral hip pain: -x> 10 years -has seen chiroprator in the past -hx trochanteric bursitis and injection in the past - did not help -occ pain in R mid back -occ neck pain -wants osteopathic treatments for this -first treatment was 04/09/17 -today reports: did much better after the last treatments, her lateral hip pain resolved for many days, she has had a little bit of pain in the last few days, she has been caring for her new grandchild and doing a lot of lifting and caring, she has not had time to do the exercises -denies:weakness, numbness, radiation, new pain complaints -important findings last visit R ant inominate, R tib-fib ext roTATED, L IT BAND TENDERPOINT, R SBR SB STRAIN   Anxiety and Depression: -mentioned at end of last visit -hx of depression -also suffers from menopause related hot flashes -started effexor and advise CBT last visit -reports:she ended up not trying the Effexor as she got back on her hormone replacement therapy and spoke with her counselor - her counselor did say the Effexor may be helpful -She is feeling somewhat better with significant reduction in the insomnia and hot flashes and improvement in her mood, still with a lot of anxiety and feeling overwhelmed at times -Does not think she is having much depression -Has very strong faith, Christian -denies:topics self harm, suicidal ideation, depression, hallucinations    ROS: See pertinent positives and negatives per HPI.  Past Medical History:  Diagnosis Date  . Abnormal stress test    2016, saw cards, nuclear scan planned  . Depression    seeing [name] - psychologist  . GERD (gastroesophageal reflux disease)   . Hyperlipemia   . Hypertension   . Melanoma ([place])    in 2016, removed; seeing Dr. [name]    Past Surgical History:  Procedure Laterality Date  . ABDOMINAL HYSTERECTOMY    . BREAST BIOPSY Right 2015  . TONSILLECTOMY  1959    Family History   Problem Relation Age of Onset  . Alcoholism Father   . Arthritis Mother   . Arthritis Sister   . Arthritis Father   . Breast cancer Unknown        Paternal cousin  . Heart disease Father   . Heart disease Mother   . Hypertension Father   . Hypertension Mother   . Mental illness Maternal Aunt   . Asthma Brother   . Healthy Son     Social History   Social History  . Marital status: Married    Spouse name: N/A  . Number of children: N/A  . Years of education: N/A   Social History Main Topics  . Smoking status: Former Smoker    Packs/day: 0.75    Years: 5.00    Types: Cigarettes    Quit date: 04/16/2007  . Smokeless tobacco: Never Used  . Alcohol use 0.0 oz/week     Comment: 1-2 drinks over the weekend, previously 1 drink nightly (martini)  . Drug use: No  . Sexual activity: Not Asked   Other Topics Concern  . None   Social History Narrative   Lives with husband in a 2 story home.  Has 2 sons.     Not currently working.  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    Education: some college.     Current Outpatient Prescriptions:  .  NON FORMULARY, Estradiol-1/74ml twice a day, Disp: , Rfl:  .  Thyroid (NATURE-THROID PO),  Take 0.5 g by mouth., Disp: , Rfl:  .  venlafaxine XR (EFFEXOR XR) 37.5 MG 24 hr capsule, Take 1 capsule (37.5 mg total) by mouth daily with breakfast. (Patient not taking: Reported on 04/16/2017), Disp: 30 capsule, Rfl: 1  EXAM:  Vitals:   04/16/17 1344  BP: 120/78  Pulse: 88  Temp: 98.4 F (36.9 C)    Body mass index is 24.07 kg/m.  GENERAL: vitals reviewed and listed above, alert, oriented, appears well hydrated and in no acute distress  HEENT: atraumatic, conjunttiva clear, no obvious abnormalities on inspection of external nose and ears  MS: moves all extremities without noticeable abnormality, right tib-fibrestricted and external rotation, decreased tension in the left IT band, still with a lateral IT band tender point, OA  rotated left, thoracic inlet rotated right, suboccipital muscle tension,right sidebending rotation cranial strain pattern, L3 to 5 rotated right side bent left, left unilateral sacral flexion  PSYCH: pleasant and cooperative, no obvious depression or anxiety  ASSESSMENT AND PLAN:  Discussed the following assessment and plan:  Pain of both hip joints -she plans to start the exercises we gave her for the IT band at her last visit -She feels like the last treatment helped a lot and wishes to do another treatment Today and again in a few weeks -Osteopathic treatment today per below  Anxiety -continue hormone replacement, advised cognitive behavioral therapy with a counselor, she has an appointment in January -She will consider Effexor further, has held off on taking for now  PROCEDURE NOTE : OSTEOPATHIC TREATMENT The decision today to treat with gentle Osteopathic Manipulative Therapy  (OMT) was based on physical exam findings. Verbal consent was  obtained after after explanation of risks and benefits. No Cervical HVLA manipulation was performed. After consent was obtained, treatment was  performed as below:      Regions treated: Head, cervical, Thoracic,lumbar,sacral, lower extremity     Techniques used: counterstrain, myofascial release, cranial The patient tolerated the treatment well and reported Improved  symptoms following treatment today. Follow up treatment was advised in: 1-2 weeks   -Patient advised to return or notify a doctor immediately if symptoms worsen or persist or new concerns arise.  There are no Patient Instructions on file for this visit.  Colin Benton R., DO

## 2017-04-16 ENCOUNTER — Encounter: Payer: Self-pay | Admitting: Family Medicine

## 2017-04-16 ENCOUNTER — Ambulatory Visit (INDEPENDENT_AMBULATORY_CARE_PROVIDER_SITE_OTHER): Payer: BLUE CROSS/BLUE SHIELD | Admitting: Family Medicine

## 2017-04-16 VITALS — BP 120/78 | HR 88 | Temp 98.4°F | Ht 64.0 in | Wt 140.2 lb

## 2017-04-16 DIAGNOSIS — M25552 Pain in left hip: Secondary | ICD-10-CM | POA: Diagnosis not present

## 2017-04-16 DIAGNOSIS — M9904 Segmental and somatic dysfunction of sacral region: Secondary | ICD-10-CM

## 2017-04-16 DIAGNOSIS — F419 Anxiety disorder, unspecified: Secondary | ICD-10-CM | POA: Diagnosis not present

## 2017-04-16 DIAGNOSIS — M9903 Segmental and somatic dysfunction of lumbar region: Secondary | ICD-10-CM | POA: Diagnosis not present

## 2017-04-16 DIAGNOSIS — M9906 Segmental and somatic dysfunction of lower extremity: Secondary | ICD-10-CM

## 2017-04-16 DIAGNOSIS — M9902 Segmental and somatic dysfunction of thoracic region: Secondary | ICD-10-CM | POA: Diagnosis not present

## 2017-04-16 DIAGNOSIS — M9901 Segmental and somatic dysfunction of cervical region: Secondary | ICD-10-CM | POA: Diagnosis not present

## 2017-04-16 DIAGNOSIS — M25551 Pain in right hip: Secondary | ICD-10-CM | POA: Diagnosis not present

## 2017-04-16 DIAGNOSIS — M9900 Segmental and somatic dysfunction of head region: Secondary | ICD-10-CM

## 2017-04-30 ENCOUNTER — Encounter: Payer: Self-pay | Admitting: Family Medicine

## 2017-04-30 ENCOUNTER — Ambulatory Visit: Payer: BLUE CROSS/BLUE SHIELD | Admitting: Family Medicine

## 2017-04-30 VITALS — BP 118/68 | HR 70 | Temp 98.6°F | Ht 64.0 in

## 2017-04-30 DIAGNOSIS — M79604 Pain in right leg: Secondary | ICD-10-CM

## 2017-04-30 DIAGNOSIS — M9902 Segmental and somatic dysfunction of thoracic region: Secondary | ICD-10-CM | POA: Diagnosis not present

## 2017-04-30 DIAGNOSIS — M9906 Segmental and somatic dysfunction of lower extremity: Secondary | ICD-10-CM

## 2017-04-30 DIAGNOSIS — M9904 Segmental and somatic dysfunction of sacral region: Secondary | ICD-10-CM

## 2017-04-30 DIAGNOSIS — F339 Major depressive disorder, recurrent, unspecified: Secondary | ICD-10-CM

## 2017-04-30 DIAGNOSIS — M9901 Segmental and somatic dysfunction of cervical region: Secondary | ICD-10-CM

## 2017-04-30 DIAGNOSIS — M9900 Segmental and somatic dysfunction of head region: Secondary | ICD-10-CM | POA: Diagnosis not present

## 2017-04-30 DIAGNOSIS — M79605 Pain in left leg: Secondary | ICD-10-CM | POA: Diagnosis not present

## 2017-04-30 NOTE — Progress Notes (Signed)
HPI:  Follow-up for osteopathic treatments for various chronic aches and pains.  Also here for follow-up of her depression and hot flashes.  Depression hot flashes: -Since her last visit she did decide to start the Effexor and she feels like she is doing better -She has seen reduction in her hot flashes, improvement in sleep and improvement in mood -Continue the Effexor at the current dose for now -no severe symptoms  Various musculoskeletal complaints:-Symptoms for over 10 years, history of chiropractic treatments, trochanteric bursitis, right mid back pain, occasional neck pain, left lateral hip pain  -Today reports: Was feeling quite well, then took an 8-hour car ride to [place] and back lateral IT band pain since,  right greater than left -Has not been doing the exercises will be provided for her IT band -Denies: Weakness, numbness, bowel or bladder incontinence  ROS: See pertinent positives and negatives per HPI.  Past Medical History:  Diagnosis Date  . Abnormal stress test    2016, saw cards, nuclear scan planned  . Depression    seeing [name] - psychologist  . GERD (gastroesophageal reflux disease)   . Hyperlipemia   . Hypertension   . Melanoma ([place])    in 2016, removed; seeing Dr. [name]    Past Surgical History:  Procedure Laterality Date  . ABDOMINAL HYSTERECTOMY    . BREAST BIOPSY Right 2015  . TONSILLECTOMY  1959    Family History  Problem Relation Age of Onset  . Alcoholism Father   . Arthritis Mother   . Arthritis Sister   . Arthritis Father   . Breast cancer Unknown        Paternal cousin  . Heart disease Father   . Heart disease Mother   . Hypertension Father   . Hypertension Mother   . Mental illness Maternal Aunt   . Asthma Brother   . Healthy Son     Social History   Socioeconomic History  . Marital status: Married    Spouse name: None  . Number of children: None  . Years of education: None  . Highest education level: None  Social  Needs  . Financial resource strain: None  . Food insecurity - worry: None  . Food insecurity - inability: None  . Transportation needs - medical: None  . Transportation needs - non-medical: None  Occupational History  . None  Tobacco Use  . Smoking status: Former Smoker    Packs/day: 0.75    Years: 5.00    Pack years: 3.75    Types: Cigarettes    Last attempt to quit: 04/16/2007    Years since quitting: 10.0  . Smokeless tobacco: Never Used  Substance and Sexual Activity  . Alcohol use: Yes    Alcohol/week: 0.0 oz    Comment: 1-2 drinks over the weekend, previously 1 drink nightly (martini)  . Drug use: No  . Sexual activity: None  Other Topics Concern  . None  Social History Narrative   Lives with husband in a 2 story home.  Has 2 sons.     Not currently working.  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    Education: some college.     Current Outpatient Medications:  .  NON FORMULARY, Estradiol-1/14ml twice a day, Disp: , Rfl:  .  Thyroid (NATURE-THROID PO), Take 0.5 g by mouth., Disp: , Rfl:  .  venlafaxine XR (EFFEXOR XR) 37.5 MG 24 hr capsule, Take 1 capsule (37.5 mg total) by mouth daily  with breakfast., Disp: 30 capsule, Rfl: 1  EXAM:  Vitals:   04/30/17 1341  BP: 118/68  Pulse: 70  Temp: 98.6 F (37 C)    Body mass index is 24.07 kg/m.  GENERAL: vitals reviewed and listed above, alert, oriented, appears well hydrated and in no acute distress  HEENT: atraumatic, conjunttiva clear, no obvious abnormalities on inspection of external nose and ears  NECK: no obvious masses on inspection  LUNGS: clear to auscultation bilaterally, no wheezes, rales or rhonchi, good air movement  CV: HRRR, no peripheral edema  MS: moves all extremities without noticeable abnormality, normal gait, normal functioning extremities, left hip internally rotated, left IT band tender point, OA rotated left, thoracic inlet rotated left, right sphenobasilar torsion, T5-8  rotated left sidebent right, right unilateral sacral strain  PSYCH: pleasant and cooperative, no obvious depression or anxiety  ASSESSMENT AND PLAN:  Discussed the following assessment and plan:  Depression, recurrent ([facility])  Pain in both lower extremities  Somatic dysfunction of head region  Somatic dysfunction of cervical region  Somatic dysfunction of thoracic region  Somatic dysfunction of sacral region  Somatic dysfunction of lower extremity  -Continue Effexor, but she is doing better -Encouraged her to do the exercises for the IT band at least 4 days/week, she has handout from prior visit -She wanted to do osteopathic treatments again today, see below -Follow-up in 3 months for depression, sooner as needed for the leg issues if they recur -Patient advised to return or notify a doctor immediately if symptoms worsen or persist or new concerns arise.  PROCEDURE NOTE : OSTEOPATHIC TREATMENT The decision today to treat with gentle Osteopathic Manipulative Therapy  (OMT) was based on physical exam findings. Verbal consent was  obtained after after explanation of risks and benefits. No Cervical HVLA manipulation was performed. After consent was obtained, treatment was  performed as below:      Regions treated: head, cervical, thoracic, sacral, lower extrenity     Techniques used: counterstain, ME, cranial The patient tolerated the treatment well and reported Improved  symptoms following treatment today. Follow up treatment was advised in: 1-2 weeks as needed   There are no Patient Instructions on file for this visit.  Colin Benton R., DO

## 2017-05-12 ENCOUNTER — Ambulatory Visit: Payer: BLUE CROSS/BLUE SHIELD | Admitting: Family Medicine

## 2017-05-21 ENCOUNTER — Encounter: Payer: Self-pay | Admitting: Family Medicine

## 2017-05-21 ENCOUNTER — Ambulatory Visit: Payer: BLUE CROSS/BLUE SHIELD | Admitting: Family Medicine

## 2017-05-21 VITALS — BP 118/78 | HR 76 | Temp 98.3°F | Ht 64.0 in

## 2017-05-21 DIAGNOSIS — M9902 Segmental and somatic dysfunction of thoracic region: Secondary | ICD-10-CM

## 2017-05-21 DIAGNOSIS — F339 Major depressive disorder, recurrent, unspecified: Secondary | ICD-10-CM | POA: Diagnosis not present

## 2017-05-21 DIAGNOSIS — R232 Flushing: Secondary | ICD-10-CM

## 2017-05-21 DIAGNOSIS — M9900 Segmental and somatic dysfunction of head region: Secondary | ICD-10-CM | POA: Diagnosis not present

## 2017-05-21 DIAGNOSIS — M79604 Pain in right leg: Secondary | ICD-10-CM | POA: Diagnosis not present

## 2017-05-21 DIAGNOSIS — M9906 Segmental and somatic dysfunction of lower extremity: Secondary | ICD-10-CM

## 2017-05-21 DIAGNOSIS — M9901 Segmental and somatic dysfunction of cervical region: Secondary | ICD-10-CM

## 2017-05-21 MED ORDER — VENLAFAXINE HCL ER 37.5 MG PO CP24: 37.5000 mg | ORAL_CAPSULE | Freq: Every day | ORAL | 5 refills | Status: DC

## 2017-05-21 NOTE — Progress Notes (Addendum)
Addendum: noticed a number of dictation errors when reading this on 07/2017 - corrected. HPI:  Follow-up various musculoskeletal complaints.  She has had trochanteric bursitis, right mid back pain, occasional neck pain and left lateral hip pain and has seen chiropractors in the past.  She has been doing some osteopathic treatments for this recently and feels this has helped quite a bit.  We also gave her exercises for the L IT band.  Today reports started the exercises and is doing them several days per week.  This has seemed to help.  The left IT band pain is mostly gone, but still has occasionally pain at night.  Doing the exercises she has felt some pain in the right IT band at times.   Depression is much better on effexor. However, it has made her have some vivid dreams and she is not sure that she likes this.  She does not want to stop or change the medication at this point.  Her husband has noticed that her mood is better and she tried many medicines in the past and they all do tend to give her vivid dreams.  Her sister tried Lexapro and she thinks this caused her to have strange dreams. No weakness, SI or thoughts of self harm.  Takes compounded hormones from her integrative physician for her hot flashes, however they did not seem to help.  She also sees the integrative doctor for her thyroid management.  She has an appointment with endocrinology to discuss this further.  ROS: See pertinent positives and negatives per HPI.  Past Medical History:  Diagnosis Date  . Abnormal stress test    2016, saw cards, nuclear scan planned  . Depression    seeing [name] - psychologist  . GERD (gastroesophageal reflux disease)   . Hyperlipemia   . Hypertension   . Melanoma ([place])    in 2016, removed; seeing Dr. [name]    Past Surgical History:  Procedure Laterality Date  . ABDOMINAL HYSTERECTOMY    . BREAST BIOPSY Right 2015  . TONSILLECTOMY  1959    Family History  Problem Relation Age of Onset   . Alcoholism Father   . Arthritis Mother   . Arthritis Sister   . Arthritis Father   . Breast cancer Unknown        Paternal cousin  . Heart disease Father   . Heart disease Mother   . Hypertension Father   . Hypertension Mother   . Mental illness Maternal Aunt   . Asthma Brother   . Healthy Son     Social History   Socioeconomic History  . Marital status: Married    Spouse name: None  . Number of children: None  . Years of education: None  . Highest education level: None  Social Needs  . Financial resource strain: None  . Food insecurity - worry: None  . Food insecurity - inability: None  . Transportation needs - medical: None  . Transportation needs - non-medical: None  Occupational History  . None  Tobacco Use  . Smoking status: Former Smoker    Packs/day: 0.75    Years: 5.00    Pack years: 3.75    Types: Cigarettes    Last attempt to quit: 04/16/2007    Years since quitting: 10.1  . Smokeless tobacco: Never Used  Substance and Sexual Activity  . Alcohol use: Yes    Alcohol/week: 0.0 oz    Comment: 1-2 drinks over the weekend, previously 1 drink  nightly (martini)  . Drug use: No  . Sexual activity: None  Other Topics Concern  . None  Social History Narrative   Lives with husband in a 2 story home.  Has 2 sons.     Not currently working.  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    Education: some college.     Current Outpatient Medications:  .  NON FORMULARY, Estradiol-1/82ml twice a day, Disp: , Rfl:  .  Thyroid (NATURE-THROID PO), Take 0.5 g by mouth., Disp: , Rfl:  .  venlafaxine XR (EFFEXOR XR) 37.5 MG 24 hr capsule, Take 1 capsule (37.5 mg total) by mouth daily with breakfast., Disp: 30 capsule, Rfl: 5  EXAM:  Vitals:   05/21/17 1439  BP: 118/78  Pulse: 76  Temp: 98.3 F (36.8 C)    Body mass index is 24.07 kg/m.  GENERAL: vitals reviewed and listed above, alert, oriented, appears well hydrated and in no acute  distress  HEENT: atraumatic, conjunttiva clear, no obvious abnormalities on inspection of external nose and ears  NECK: no obvious masses on inspection  MS: moves all extremities without noticeable abnormality, gait is normal, she has a right IT band tender point, no tender points in the left IT band today, right tib-fib is rotated, side bending rotation sphenobasilar strain, mild TI rot to the R.  PSYCH: pleasant and cooperative, no obvious depression or anxiety  ASSESSMENT AND PLAN:  Discussed the following assessment and plan:  Depression, recurrent ([place]) -Discussed various treatment options for depression -Is opted for now to continue the low-dose of Effexor she feels the benefits do outweigh side effects -Advised follow-up in 3 months or to call in the interim as needed  Hot flashes -She did not feel the compounded hormones helped at all, so I would think that it would be better to get off of these -Effexor has helped significantly and if she plans to continue this she may not need hormonal therapy -She wants to keep her appointment with the endocrinologist and still talked this through with them, there also is a possibility she will discontinue the Effexor if the dreams get worse  Pain of right lower extremity Somatic dysfunction of head region Somatic dysfunction of cervical region Somatic dysfunction of lower extremity Somatic dysfunction of thoracic region -Seems to be doing much better overall, wanted to do osteopathic treatment today, see below -Advised to continue the home exercises  PROCEDURE NOTE : OSTEOPATHIC TREATMENT The decision today to treat with gentle Osteopathic Manipulative Therapy  (OMT) was based on physical exam findings, diagnoses and patient wishes. Verbal consent was obtained after after explanation of risks and benefits. No Cervical HVLA manipulation was performed. After consent was obtained, treatment was  performed as below:      Regions  treated: Head, cervical, thoracic, lower extremity     Techniques used: Counterstrain, myofascial release, cranial The patient tolerated the treatment well and reported Improved  symptoms following treatment today. Follow up treatment was advised in: As needed  DBS  There are no Patient Instructions on file for this visit.  Colin Benton R., DO

## 2017-06-14 ENCOUNTER — Other Ambulatory Visit: Payer: Self-pay | Admitting: Family Medicine

## 2017-06-25 ENCOUNTER — Encounter: Payer: Self-pay | Admitting: Family Medicine

## 2017-07-22 ENCOUNTER — Encounter: Payer: Self-pay | Admitting: Internal Medicine

## 2017-07-22 ENCOUNTER — Ambulatory Visit: Payer: BLUE CROSS/BLUE SHIELD | Admitting: Internal Medicine

## 2017-07-22 VITALS — BP 102/68 | HR 69 | Temp 97.9°F | Ht 64.0 in | Wt 138.6 lb

## 2017-07-22 DIAGNOSIS — E039 Hypothyroidism, unspecified: Secondary | ICD-10-CM | POA: Diagnosis not present

## 2017-07-22 DIAGNOSIS — N951 Menopausal and female climacteric states: Secondary | ICD-10-CM

## 2017-07-22 LAB — TSH: TSH: 2.41 u[IU]/mL (ref 0.35–4.50)

## 2017-07-22 LAB — T3, FREE: T3 FREE: 4.2 pg/mL (ref 2.3–4.2)

## 2017-07-22 LAB — T4, FREE: FREE T4: 0.69 ng/dL (ref 0.60–1.60)

## 2017-07-22 NOTE — Patient Instructions (Addendum)
Please stop at the lab.  Continue NP thyroid 65 mg daily.  Take the thyroid hormone every day, with water, at least 30 minutes before breakfast, separated by at least 4 hours from: - acid reflux medications - calcium - iron - multivitamins  Stop Testosterone and Progesterone.  Continue Estrogen cream for now.  Please come back for a follow-up appointment in 3 months.

## 2017-07-22 NOTE — Progress Notes (Signed)
Patient ID: Olivia Morrison, female   DOB: [birth year], 67 y.o.   MRN: [id number]    HPI  Olivia Morrison is a 67 y.o.-year-old female, referred by her PCP, Dr. Maudie Mercury, for management of hypothyroidism and hot flashes.  Patient has been seen by an integrative medicine doctor for the last 15 years. She has been on thyroid supplementation since 2016-2017, currently on Nature-Throid 65 mg daily (1 gr).  It is not clear how the diagnosis of hypothyroidism was made but she does remember that this was mild.  She takes the thyroid hormone: - fasting - with water - separated by >30 min from b'fast  - no calcium, iron - + multivitamins with b'fast (!) - no PPIs.  I reviewed pt's thyroid tests: 2018: TSH reportedly normal Lab Results  Component Value Date   TSH 0.63 05/22/2015   FREET4 0.64 05/22/2015    Pt describes: - no weight gain - + fatigue - no cold intolerance, but has bothersome hot flashes - + Both anxiety and depression - + constipation - no dry skin - + hair loss  Pt denies feeling nodules in neck, hoarseness, dysphagia/odynophagia, SOB with lying down.  She has no FH of thyroid disorders. No FH of thyroid cancer.  No h/o radiation tx to head or neck. No recent use of iodine supplements.  Patient is also on testosterone cream, estrogen, and micronized progesterone. She tried to come off >> but more anxiety and hot flushes >> Effexor was started >> helps with hot flushes but has lucid dreams with her deceased parents.  She has a h/o TAH in her 65s.  She has been on HRT since then.  ROS: Constitutional: + See HPI Eyes: + Blurry vision, no xerophthalmia ENT: no sore throat, no nodules palpated in throat, no dysphagia/odynophagia, no hoarseness, + decreased hearing and tinnitus Cardiovascular: no CP/SOB/+ palpitations/no leg swelling Respiratory: no cough/SOB Gastrointestinal: no N/V/D/C Musculoskeletal: no muscle/+ joint aches Skin: no rashes, + hair loss Neurological: no  tremors/numbness/tingling/dizziness Psychiatric: + both: depression/anxiety + Low libido  Past Medical History:  Diagnosis Date  . Abnormal stress test    2016, saw cards, nuclear scan planned  . Depression    seeing [name] - psychologist  . GERD (gastroesophageal reflux disease)   . Hyperlipemia   . Hypertension   . Melanoma ([place])    in 2016, removed; seeing Dr. [name]   Past Surgical History:  Procedure Laterality Date  . ABDOMINAL HYSTERECTOMY    . BREAST BIOPSY Right 2015  . TONSILLECTOMY  1959   Social History   Socioeconomic History  . Marital status: Married    Spouse name: Not on file  . Number of children: 2  Social Needs  Occupational History  .  Retired  Tobacco Use  . Smoking status: Former Smoker    Packs/day: 0.75    Years: 5.00    Pack years: 3.75    Types: Cigarettes    Last attempt to quit: 04/16/2007    Years since quitting: 10.2  . Smokeless tobacco: Never Used  Substance and Sexual Activity  . Alcohol use: Yes    Alcohol/week: 0.0 oz    Comment: 1-2 drinks over the weekend, previously 1 drink nightly (martini)  . Drug use: No  . Sexual activity: Not on file  Other Topics Concern  . Not on file  Social History Narrative   Lives with husband in a 2 story home.  Has 2 sons.     Not currently working.  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    Education: some college.   Current Outpatient Medications on File Prior to Visit  Medication Sig Dispense Refill  . NON FORMULARY Estradiol-1/39ml twice a day    . Thyroid (NATURE-THROID PO) Take 0.5 g by mouth.    . venlafaxine XR (EFFEXOR XR) 37.5 MG 24 hr capsule Take 1 capsule (37.5 mg total) by mouth daily with breakfast. 30 capsule 5  . venlafaxine XR (EFFEXOR-XR) 37.5 MG 24 hr capsule TAKE 1 CAPSULE(37.5 MG) BY MOUTH DAILY WITH BREAKFAST 30 capsule 5   No current facility-administered medications on file prior to visit.    Allergies  Allergen Reactions  . Tetanus Toxoids Other  (See Comments)    Symptoms of lockjaw and flu per patient   Family History  Problem Relation Age of Onset  . Alcoholism Father   . Arthritis Mother   . Arthritis Sister   . Arthritis Father   . Breast cancer Unknown        Paternal cousin  . Heart disease Father   . Heart disease Mother   . Hypertension Father   . Hypertension Mother   . Mental illness Maternal Aunt   . Asthma Brother   . Healthy Son     PE: BP 102/68 (BP Location: Left Arm, Patient Position: Sitting, Cuff Size: Normal)   Pulse 69   Temp 97.9 F (36.6 C) (Oral)   Ht 5\' 4"  (1.626 m)   Wt 138 lb 9.6 oz (62.9 kg)   SpO2 97%   BMI 23.79 kg/m  Wt Readings from Last 3 Encounters:  07/22/17 138 lb 9.6 oz (62.9 kg)  04/16/17 140 lb 3.2 oz (63.6 kg)  03/30/17 140 lb 12.8 oz (63.9 kg)   Constitutional: normal weight, in NAD Eyes: PERRLA, EOMI, no exophthalmos ENT: moist mucous membranes, no thyromegaly, no cervical lymphadenopathy Cardiovascular: RRR, No MRG Respiratory: CTA B Gastrointestinal: abdomen soft, NT, ND, BS+ Musculoskeletal: no deformities, strength intact in all 4 Skin: moist, warm, no rashes Neurological: no tremor with outstretched hands, DTR normal in all 4  ASSESSMENT: 1. Hypothyroidism - Unclear if this is a real diagnosis or she was started on thyroid hormones to improve her fatigue  2. Hot flushes  PLAN:  1. Patient with few years history of presumed hypothyroidism, on desiccated thyroid extract: Nature-Throid 65 mg daily - she appears euthyroid, but she complains of anxiety, hot flashes, palpitations  - we discussed about the possibility of switching from a combination of T4 + T3 to only T4 to see if her symptoms improve.  I explained that her initial diagnosis of hypothyroidism is unclear, so we can try to reduce the dose of her thyroid supplement to see if she still requires thyroid medication in the near future.  She agrees with this.  The key is to do this slowly, so she does not  feel hypothyroid while we are decreasing the doses. - she does not appear to have a goiter, thyroid nodules, or neck compression symptoms - We discussed about correct intake of levothyroxine, fasting, with water, separated by at least 30 minutes from breakfast, and separated by more than 4 hours from calcium, iron, multivitamins, acid reflux medications (PPIs).  I advised her to move her multivitamins at least 4 hours after thyroid medication. - will check thyroid tests today: TSH, free T4 and free T3 - If labs today are abnormal, she will need to return in ~6 weeks for repeat labs - Otherwise,  I will see her back in 3 months  2.  Hot flashes - Possibly related to her desiccated thyroid extract.  Please see above. - I also advised her to stop testosterone gel and progesterone - for now, she can continue with the compounded estrogen cream, but we discussed about starting to reduce this to off depending on the results of the thyroid medication changes above. She was advised by her integrative medicine provider to increase the estrogen dose, but I advised her not to do this, even ideally to try to decrease it. - Her hot flashes are much better on Effexor, but she developed vivid dreams and will discuss with PCP about reducing the dose. - I also suggested Relizen, a natural supplement that helps with hot flashes  Component     Latest Ref Rng & Units 07/22/2017  TSH     0.35 - 4.50 uIU/mL 2.41  Triiodothyronine,Free,Serum     2.3 - 4.2 pg/mL 4.2  T4,Free(Direct)     0.60 - 1.60 ng/dL 0.69   TFTs normal >> will change from [place] 65 mg to LT4 100 mcg daily.  Philemon Kingdom, MD PhD [facility]

## 2017-07-23 ENCOUNTER — Encounter: Payer: Self-pay | Admitting: Internal Medicine

## 2017-07-23 MED ORDER — LEVOTHYROXINE SODIUM 100 MCG PO TABS: 100.0000 ug | ORAL_TABLET | Freq: Every day | ORAL | 3 refills | Status: DC

## 2017-07-24 ENCOUNTER — Encounter: Payer: Self-pay | Admitting: Family Medicine

## 2017-07-24 NOTE — Telephone Encounter (Signed)
Please advise taper schedule for patient. Per last notes, she did discus discontinuing med if dreams became worse.

## 2017-07-29 NOTE — Progress Notes (Signed)
HPI:  Follow up MDD, recurrent/Anxiety: -long hx recurrent depression, sees psychologist -has tried several medications for depression and did not tolerate some, on lamictal and wellbutrin remotely  - most recently on effexor and it did help some pretty significant depression, and hot flashes but has caused more vivid dreams which is distressing to her -she wants to stop the effexor -It seems she feels strongly her mood issues were a hormonal issue - was seeing integrative doctor, now seeing endocrinologist and back on levothyroxine and she thinks other product contributed to mood/hot flash issues -denies depression, SI, thoughts of harm -Reports history of vivid dreams and nightmares prior to using Effexor, but thinks these have worsened with the medication -She has more anxiety in the last several years and Effexor did help this as well  ROS: See pertinent positives and negatives per HPI.  Past Medical History:  Diagnosis Date  . Abnormal stress test    2016, saw cards, nuclear scan planned  . Depression    seeing [name] - psychologist  . GERD (gastroesophageal reflux disease)   . Hyperlipemia   . Hypertension   . Melanoma ([place])    in 2016, removed; seeing Dr. [name]    Past Surgical History:  Procedure Laterality Date  . ABDOMINAL HYSTERECTOMY    . BREAST BIOPSY Right 2015  . TONSILLECTOMY  1959    Family History  Problem Relation Age of Onset  . Alcoholism Father   . Arthritis Mother   . Arthritis Sister   . Arthritis Father   . Breast cancer Unknown        Paternal cousin  . Heart disease Father   . Heart disease Mother   . Hypertension Father   . Hypertension Mother   . Mental illness Maternal Aunt   . Asthma Brother   . Healthy Son     Social History   Socioeconomic History  . Marital status: Married    Spouse name: None  . Number of children: None  . Years of education: None  . Highest education level: None  Social Needs  . Financial resource  strain: None  . Food insecurity - worry: None  . Food insecurity - inability: None  . Transportation needs - medical: None  . Transportation needs - non-medical: None  Occupational History  . None  Tobacco Use  . Smoking status: Former Smoker    Packs/day: 0.75    Years: 5.00    Pack years: 3.75    Types: Cigarettes    Last attempt to quit: 04/16/2007    Years since quitting: 10.2  . Smokeless tobacco: Never Used  Substance and Sexual Activity  . Alcohol use: Yes    Alcohol/week: 0.0 oz    Comment: 1-2 drinks over the weekend, previously 1 drink nightly (martini)  . Drug use: No  . Sexual activity: None  Other Topics Concern  . None  Social History Narrative   Lives with husband in a 2 story home.  Has 2 sons.     Not currently working.  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    Education: some college.     Current Outpatient Medications:  .  levothyroxine (SYNTHROID, LEVOTHROID) 100 MCG tablet, Take 1 tablet (100 mcg total) by mouth daily., Disp: 45 tablet, Rfl: 3 .  NON FORMULARY, Estradiol-1/78ml twice a day, Disp: , Rfl:  .  venlafaxine XR (EFFEXOR XR) 37.5 MG 24 hr capsule, Take 1 capsule (37.5 mg total) by mouth daily  with breakfast., Disp: 30 capsule, Rfl: 5  EXAM:  Vitals:   07/30/17 1137  BP: 108/68  Pulse: 67  Temp: 97.8 F (36.6 C)    Body mass index is 24.39 kg/m.  GENERAL: vitals reviewed and listed above, alert, oriented, appears well hydrated and in no acute distress  HEENT: atraumatic, conjunttiva clear, no obvious abnormalities on inspection of external nose and ears  NECK: no obvious masses on inspection  LUNGS: clear to auscultation bilaterally, no wheezes, rales or rhonchi, good air movement  CV: HRRR, no peripheral edema  MS: moves all extremities without noticeable abnormality  PSYCH: pleasant and cooperative, no obvious depression or anxiety  ASSESSMENT AND PLAN:  Discussed the following assessment and plan:  Mood  disorder (HCC)  Hypothyroidism, unspecified type  Menopausal hot flushes  -She is opted to come off of the Effexor, discussed other possible treatment options, she declined pharmacological treatment for now, she does see a counselor -She can talk with the pharmacist about tapering the capsule as she is sensitive to medication changes -Recommended she follow-up promptly if mood worsens off the medication -I am glad she is seeing the endocrinologist and is back on levothyroxine and she feels better -Reports she is tapering off of her conjugated estrogens, she will see a gynecologist if she decides she wants to restart these in the future -Patient advised to return or notify a doctor immediately if symptoms worsen or persist or new concerns arise. Declined AVS  There are no Patient Instructions on file for this visit.  Olivia Kern, DO

## 2017-07-30 ENCOUNTER — Ambulatory Visit: Payer: BLUE CROSS/BLUE SHIELD | Admitting: Family Medicine

## 2017-07-30 ENCOUNTER — Encounter: Payer: Self-pay | Admitting: Family Medicine

## 2017-07-30 VITALS — BP 108/68 | HR 67 | Temp 97.8°F | Ht 64.0 in | Wt 142.1 lb

## 2017-07-30 DIAGNOSIS — F39 Unspecified mood [affective] disorder: Secondary | ICD-10-CM | POA: Diagnosis not present

## 2017-07-30 DIAGNOSIS — N951 Menopausal and female climacteric states: Secondary | ICD-10-CM

## 2017-07-30 DIAGNOSIS — E039 Hypothyroidism, unspecified: Secondary | ICD-10-CM

## 2017-08-31 ENCOUNTER — Other Ambulatory Visit (INDEPENDENT_AMBULATORY_CARE_PROVIDER_SITE_OTHER): Payer: BLUE CROSS/BLUE SHIELD

## 2017-08-31 ENCOUNTER — Other Ambulatory Visit: Payer: Self-pay | Admitting: Internal Medicine

## 2017-08-31 DIAGNOSIS — E039 Hypothyroidism, unspecified: Secondary | ICD-10-CM | POA: Diagnosis not present

## 2017-08-31 LAB — T3, FREE: T3 FREE: 3 pg/mL (ref 2.3–4.2)

## 2017-08-31 LAB — T4, FREE: Free T4: 1.14 ng/dL (ref 0.60–1.60)

## 2017-08-31 LAB — TSH: TSH: 0.18 u[IU]/mL — AB (ref 0.35–4.50)

## 2017-08-31 MED ORDER — LEVOTHYROXINE SODIUM 88 MCG PO TABS: 88.0000 ug | ORAL_TABLET | Freq: Every day | ORAL | 1 refills | Status: DC

## 2017-09-16 ENCOUNTER — Encounter: Payer: Self-pay | Admitting: Family Medicine

## 2017-10-20 ENCOUNTER — Ambulatory Visit (INDEPENDENT_AMBULATORY_CARE_PROVIDER_SITE_OTHER): Payer: Medicare HMO | Admitting: Internal Medicine

## 2017-10-20 ENCOUNTER — Encounter: Payer: Self-pay | Admitting: Family Medicine

## 2017-10-20 ENCOUNTER — Encounter: Payer: Self-pay | Admitting: Internal Medicine

## 2017-10-20 VITALS — BP 128/74 | HR 65 | Ht 64.0 in | Wt 144.4 lb

## 2017-10-20 DIAGNOSIS — E039 Hypothyroidism, unspecified: Secondary | ICD-10-CM

## 2017-10-20 DIAGNOSIS — N951 Menopausal and female climacteric states: Secondary | ICD-10-CM

## 2017-10-20 LAB — T4, FREE: Free T4: 0.95 ng/dL (ref 0.60–1.60)

## 2017-10-20 LAB — TSH: TSH: 0.65 u[IU]/mL (ref 0.35–4.50)

## 2017-10-20 NOTE — Progress Notes (Signed)
Patient ID: Olivia Morrison, female   DOB: [birth year], 67 y.o.   MRN: [id number]    HPI  Olivia Morrison is a 67 y.o.-year-old female, referred by her PCP, Dr. Maudie Mercury, for management of hypothyroidism and hot flashes.  Reviewed and addended history: Patient has been seen by an integrative medicine doctor for the last 15 years. She has been on thyroid supplementation since 2016-2017, then on Nature-Throid 65 mg daily (1 gr).  It is not clear how the diagnosis of hypothyroidism was made but she does remember that this was mild.  At last visit, she was having hyperthyroid symptoms so we changed to levothyroxine 100 mcg daily.  As a TSH was slightly low afterwards, we decreased the dose of levothyroxine in 08/2017.  Pt is on levothyroxine  65 mcg daily, taken: - in am - fasting - at least 30 min from b'fast - no Ca, Fe, PPIs - She was taking multivitamins with breakfast but we moved him later in the day at last visit   - not on Biotin  I reviewed pt's thyroid tests: Reviewed patient's TFTs: Lab Results  Component Value Date   TSH 0.18 (L) 08/31/2017   TSH 2.41 07/22/2017   TSH 0.63 05/22/2015   FREET4 1.14 08/31/2017   FREET4 0.69 07/22/2017   FREET4 0.64 05/22/2015  2018: TSH reportedly normal   At last visit, she described fatigue, anxiety/depression, constipation, and hair loss. At this visit, she continues to complain of: - hot flushes  - fatigue  Pt denies: - feeling nodules in neck - hoarseness - dysphagia - choking - SOB with lying down  She has no FH of thyroid disorders. No FH of thyroid cancer. No h/o radiation tx to head or neck.  No seaweed or kelp. No recent contrast studies. No herbal supplements. No Biotin use. No recent steroids use.   She has a h/o TAH in her 67s.  She has been on HRT since then. At last visit, she was also on testosterone cream, estrogen, and micronized progesterone. She tried to come off >> but more anxiety and hot flushes >> Effexor was started  >> helps with hot flushes but has lucid dreams with her deceased parents >> stopped now.  Last visit, I suggested to stop testosterone and progesterone, and to taper estrogen to off >> she is off all these now.  ROS: Constitutional: + see HPI Eyes: no blurry vision, no xerophthalmia ENT: no sore throat, + see HPI Cardiovascular: no CP/no SOB/+ palpitations - with hot flushes/no leg swelling Respiratory: no cough/no SOB/no wheezing Gastrointestinal: no N/no V/no D/no C/no acid reflux Musculoskeletal: no muscle aches/no joint aches Skin: no rashes, no hair loss Neurological: no tremors/no numbness/no tingling/no dizziness  I reviewed pt's medications, allergies, PMH, social hx, family hx, and changes were documented in the history of present illness. Otherwise, unchanged from my initial visit note.  Past Medical History:  Diagnosis Date  . Abnormal stress test    2016, saw cards, nuclear scan planned  . Depression    seeing [name] - psychologist  . GERD (gastroesophageal reflux disease)   . Hyperlipemia   . Hypertension   . Melanoma ([place])    in 2016, removed; seeing Dr. [name]   Past Surgical History:  Procedure Laterality Date  . ABDOMINAL HYSTERECTOMY    . BREAST BIOPSY Right 2015  . TONSILLECTOMY  1959   Social History   Socioeconomic History  . Marital status: Married    Spouse name: Not on file  .  Number of children: 2  Social Needs  Occupational History  .  Retired  Tobacco Use  . Smoking status: Former Smoker    Packs/day: 0.75    Years: 5.00    Pack years: 3.75    Types: Cigarettes    Last attempt to quit: 04/16/2007    Years since quitting: 10.2  . Smokeless tobacco: Never Used  Substance and Sexual Activity  . Alcohol use: Yes    Alcohol/week: 0.0 oz    Comment: 1-2 drinks over the weekend, previously 1 drink nightly (martini)  . Drug use: No  . Sexual activity: Not on file  Other Topics Concern  . Not on file  Social History Narrative   Lives with  husband in a 2 story home.  Has 2 sons.     Not currently working.  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    Education: some college.   Current Outpatient Medications on File Prior to Visit  Medication Sig Dispense Refill  . levothyroxine (SYNTHROID, LEVOTHROID) 88 MCG tablet Take 1 tablet (88 mcg total) by mouth daily. 60 tablet 1  . NON FORMULARY Estradiol-1/38ml twice a day    . venlafaxine XR (EFFEXOR XR) 37.5 MG 24 hr capsule Take 1 capsule (37.5 mg total) by mouth daily with breakfast. 30 capsule 5   No current facility-administered medications on file prior to visit.    Allergies  Allergen Reactions  . Tetanus Toxoids Other (See Comments)    Symptoms of lockjaw and flu per patient   Family History  Problem Relation Age of Onset  . Alcoholism Father   . Arthritis Mother   . Arthritis Sister   . Arthritis Father   . Breast cancer Unknown        Paternal cousin  . Heart disease Father   . Heart disease Mother   . Hypertension Father   . Hypertension Mother   . Mental illness Maternal Aunt   . Asthma Brother   . Healthy Son     PE: BP 128/74   Pulse 65   Ht 5\' 4"  (1.626 m)   Wt 144 lb 6.4 oz (65.5 kg)   SpO2 99%   BMI 24.79 kg/m  Wt Readings from Last 3 Encounters:  10/20/17 144 lb 6.4 oz (65.5 kg)  07/30/17 142 lb 1.6 oz (64.5 kg)  07/22/17 138 lb 9.6 oz (62.9 kg)   Constitutional: Normal weight, in NAD Eyes: PERRLA, EOMI, no exophthalmos ENT: moist mucous membranes, no thyromegaly, no cervical lymphadenopathy Cardiovascular: RRR, No MRG Respiratory: CTA B Gastrointestinal: abdomen soft, NT, ND, BS+ Musculoskeletal: no deformities, strength intact in all 4 Skin: moist, warm, no rashes Neurological: no tremor with outstretched hands, DTR normal in all 4  ASSESSMENT: 1. Hypothyroidism - Unclear if this is a real diagnosis or she was started on thyroid hormones to improve her fatigue  2. Hot flushes  PLAN:  1. Patient with few years  history of presumed hypothyroidism, on desiccated thyroid extract at last visit (Nature-Throid 65 mg daily), with complaints of anxiety, hot flashes, palpitations.  We switched her to levothyroxine 100 mcg daily, the equivalent dose, and her symptoms improved significantly. Her TSH was slightly low after the change, so we decreased the dose to 88 mcg daily. - she continues on LT4 88 mcg daily - pt feels good on this dose, but still has fatigue, hot flushes, weight gain - we discussed about taking the thyroid hormone every day, with water, >30 minutes before  breakfast, separated by >4 hours from acid reflux medications, calcium, iron, multivitamins. Pt. is taking it correctly. - will check thyroid tests today: TSH and fT4 - If labs are abnormal, she will need to return for repeat TFTs in 1.5 months  2.  Hot flashes -possibly 2/2 menopause, but likely prev. Exacerbated by herdesiccated thyroid extract -At last visit, I also advised her to stop testosterone gel and progesterone. At that time, we also discussed about continuing the compounded estrogen cream but tapering the dose to off. She stopped since. I also suggested Relizen, a natural supplement that could help with hot flashes. She started this >> helping some. -At this visit, her hot flashes are still present >> rec'd to see ObGyn for HRT with E patches, but I would like to check first  if she is still overreplaced with LT4   Needs refills of LT4.  Component     Latest Ref Rng & Units 10/20/2017  TSH     0.35 - 4.50 uIU/mL 0.65  T4,Free(Direct)     0.60 - 1.60 ng/dL 0.95  Thyroid tests are normal. We will continue with levothyroxine 88 mcg daily.  Philemon Kingdom, MD PhD [facility]

## 2017-10-20 NOTE — Patient Instructions (Addendum)
Please stop at the lab.  Please continue Levothyroxine.  Take the thyroid hormone every day, with water, at least 30 minutes before breakfast, separated by at least 4 hours from: - acid reflux medications - calcium - iron - multivitamins  Please come back for a follow-up appointment in 6 months.

## 2017-10-21 MED ORDER — LEVOTHYROXINE SODIUM 88 MCG PO TABS: 88.0000 ug | ORAL_TABLET | Freq: Every day | ORAL | 3 refills | Status: DC

## 2017-10-29 ENCOUNTER — Ambulatory Visit: Payer: Medicare HMO | Admitting: Family Medicine

## 2017-11-03 DIAGNOSIS — H02413 Mechanical ptosis of bilateral eyelids: Secondary | ICD-10-CM | POA: Diagnosis not present

## 2017-11-05 ENCOUNTER — Ambulatory Visit (INDEPENDENT_AMBULATORY_CARE_PROVIDER_SITE_OTHER): Payer: Medicare HMO | Admitting: Family Medicine

## 2017-11-05 ENCOUNTER — Encounter: Payer: Self-pay | Admitting: Family Medicine

## 2017-11-05 VITALS — BP 122/70 | HR 75 | Temp 98.3°F | Ht 64.0 in | Wt 143.3 lb

## 2017-11-05 DIAGNOSIS — M25559 Pain in unspecified hip: Secondary | ICD-10-CM

## 2017-11-05 DIAGNOSIS — M9901 Segmental and somatic dysfunction of cervical region: Secondary | ICD-10-CM

## 2017-11-05 DIAGNOSIS — G8929 Other chronic pain: Secondary | ICD-10-CM | POA: Diagnosis not present

## 2017-11-05 DIAGNOSIS — M9903 Segmental and somatic dysfunction of lumbar region: Secondary | ICD-10-CM

## 2017-11-05 DIAGNOSIS — M9906 Segmental and somatic dysfunction of lower extremity: Secondary | ICD-10-CM | POA: Diagnosis not present

## 2017-11-05 DIAGNOSIS — M9902 Segmental and somatic dysfunction of thoracic region: Secondary | ICD-10-CM | POA: Diagnosis not present

## 2017-11-05 DIAGNOSIS — M9905 Segmental and somatic dysfunction of pelvic region: Secondary | ICD-10-CM

## 2017-11-05 DIAGNOSIS — M545 Low back pain, unspecified: Secondary | ICD-10-CM

## 2017-11-05 NOTE — Patient Instructions (Signed)
Follow up in 1-2 weeks for OMT  -We placed a referral for you as discussed for physical therapy. It usually takes about 1-2 weeks to process and schedule this referral. If you have not heard from [place] regarding this appointment in 2 weeks please contact our office.  -consider acupuncture

## 2017-11-05 NOTE — Progress Notes (Signed)
HPI:  Using dictation device. Unfortunately this device frequently misinterprets words/phrases.  Acute visit for OM T for L lateral hip pain: -intermittent, chronic, used to see chiropractor, responded well to OMT in the past -hx trochanteric bursitis and tight L IT band, currently not doing any exercises for this on a regular basis -some low back pain at times - hurt last week -up and down with grandchildren a lot -no imaging in some time, > 8 years -denies fevers, malaise, radiation, weakness, numbness, BB incont  ROS: See pertinent positives and negatives per HPI.  Past Medical History:  Diagnosis Date  . Abnormal stress test    2016, saw cards, nuclear scan planned  . Depression    seeing [name] - psychologist  . GERD (gastroesophageal reflux disease)   . Hyperlipemia   . Hypertension   . Melanoma ([place])    in 2016, removed; seeing Dr. [name]    Past Surgical History:  Procedure Laterality Date  . ABDOMINAL HYSTERECTOMY    . BREAST BIOPSY Right 2015  . TONSILLECTOMY  1959    Family History  Problem Relation Age of Onset  . Alcoholism Father   . Arthritis Mother   . Arthritis Sister   . Arthritis Father   . Breast cancer Unknown        Paternal cousin  . Heart disease Father   . Heart disease Mother   . Hypertension Father   . Hypertension Mother   . Mental illness Maternal Aunt   . Asthma Brother   . Healthy Son     SOCIAL HX: see hpi   Current Outpatient Medications:  .  Acetylcysteine (NAC) 600 MG CAPS, Take by mouth., Disp: , Rfl:  .  Barberry-Oreg Grape-Goldenseal (BERBERINE COMPLEX PO), Take 500 mg by mouth., Disp: , Rfl:  .  Cholecalciferol (VITAMIN D3) 5000 units CAPS, Take by mouth., Disp: , Rfl:  .  levothyroxine (SYNTHROID, LEVOTHROID) 88 MCG tablet, Take 1 tablet (88 mcg total) by mouth daily., Disp: 90 tablet, Rfl: 3 .  MAGNESIUM CITRATE PO, Take 150 mg by mouth., Disp: , Rfl:  .  Menaquinone-7 (VITAMIN K2 PO), Take by mouth., Disp: , Rfl:   .  Multiple Vitamins-Minerals (ZINC PO), Take by mouth., Disp: , Rfl:  .  NON FORMULARY, Relizen herbal supplement for hot flashes, Disp: , Rfl:  .  Nutritional Supplements (JUICE PLUS FIBRE PO), Take by mouth., Disp: , Rfl:  .  THEANINE PO, Take by mouth., Disp: , Rfl:   EXAM:  Vitals:   11/05/17 1428  BP: 122/70  Pulse: 75  Temp: 98.3 F (36.8 C)    Body mass index is 24.6 kg/m.  GENERAL: vitals reviewed and listed above, alert, oriented, appears well hydrated and in no acute distress  HEENT: atraumatic, conjunttiva clear, no obvious abnormalities on inspection of external nose and ears  NECK: no obvious masses on inspection  MS: moves all extremities without noticeable abnormality, gait normal, TTP L IT band, TTP bilater lumbar paraspinal muscles with spasm, normal int/ext rot hips, neg SLRT, T3-5 Rr, Sl, L IT tenderpoint, L ant innominate, R piriformis TP, OA Rr  PSYCH: pleasant and cooperative, no obvious depression or anxiety  ASSESSMENT AND PLAN:  Discussed the following assessment and plan:  Lateral pain of hip - Plan: Ambulatory referral to Physical Therapy  Chronic low back pain without sciatica, unspecified back pain laterality - Plan: Ambulatory referral to Physical Therapy  Somatic dysfunction of spine, lumbar  Somatic dysfunction of pelvic region  Somatic dysfunction of spine, thoracic  Somatic dysfunction of spine, cervical  Somatic dysfunction of left lower extremity  -discussed options for further eval and management -she opted for PT (referral placed), consideration imaging (plain films next visit if not improving, ? MRI), OMT - see below, and consideration acupuncture  PROCEDURE NOTE : OSTEOPATHIC TREATMENT The decision today to treat with gentle Osteopathic Manipulative Therapy  (OMT) was based on physical exam findings, diagnoses and patient wishes. Verbal consent was obtained after after explanation of risks and benefits. No Cervical  HVLA manipulation was performed. After consent was obtained, treatment was  performed as below:      Regions treated:  Cerv, thor, lumbar, pelv, LE     Techniques used: counterstrain, MR, ME The patient tolerated the treatment well and reported Improved  symptoms following treatment today. Follow up treatment was advised in: 1-2 weeks  -Patient advised to return or notify a doctor immediately if symptoms worsen or persist or new concerns arise.  Patient Instructions  Follow up in 1-2 weeks for OMT  -We placed a referral for you as discussed for physical therapy. It usually takes about 1-2 weeks to process and schedule this referral. If you have not heard from [place] regarding this appointment in 2 weeks please contact our office.  -consider acupuncture   Olivia Kern, DO

## 2017-11-16 ENCOUNTER — Other Ambulatory Visit: Payer: Self-pay

## 2017-11-16 ENCOUNTER — Ambulatory Visit: Payer: Medicare HMO | Attending: Family Medicine | Admitting: Physical Therapy

## 2017-11-16 DIAGNOSIS — M545 Low back pain: Secondary | ICD-10-CM | POA: Diagnosis not present

## 2017-11-16 DIAGNOSIS — M6281 Muscle weakness (generalized): Secondary | ICD-10-CM | POA: Diagnosis not present

## 2017-11-16 DIAGNOSIS — Z01818 Encounter for other preprocedural examination: Secondary | ICD-10-CM | POA: Diagnosis not present

## 2017-11-16 DIAGNOSIS — M25552 Pain in left hip: Secondary | ICD-10-CM | POA: Diagnosis not present

## 2017-11-16 DIAGNOSIS — D492 Neoplasm of unspecified behavior of bone, soft tissue, and skin: Secondary | ICD-10-CM | POA: Diagnosis not present

## 2017-11-16 DIAGNOSIS — G8929 Other chronic pain: Secondary | ICD-10-CM | POA: Diagnosis not present

## 2017-11-16 DIAGNOSIS — H02413 Mechanical ptosis of bilateral eyelids: Secondary | ICD-10-CM | POA: Diagnosis not present

## 2017-11-16 NOTE — Therapy (Addendum)
[facility] [address], [place], [name], [address] Phone: [phone]   Fax:  [phone]  Physical Therapy Evaluation  Patient Details  Name: Olivia Morrison MRN: [id number] Date of Birth: [birth year] Referring Provider: Lucretia Kern, DO   Encounter Date: 11/16/2017  PT End of Session - 11/16/17 1655    Visit Number  1    Date for PT Re-Evaluation  01/11/18    Authorization Type  medicare    Authorization - Visit Number  --    Authorization - Number of Visits  --    PT Start Time  1448    PT Stop Time  1230    PT Time Calculation (min)  42 min    Activity Tolerance  Patient tolerated treatment well    Behavior During Therapy  [facility] for tasks assessed/performed       Past Medical History:  Diagnosis Date  . Abnormal stress test    2016, saw cards, nuclear scan planned  . Depression    seeing [name] - psychologist  . GERD (gastroesophageal reflux disease)   . Hyperlipemia   . Hypertension   . Melanoma ([place])    in 2016, removed; seeing Dr. [name]    Past Surgical History:  Procedure Laterality Date  . ABDOMINAL HYSTERECTOMY    . BREAST BIOPSY Right 2015  . TONSILLECTOMY  1959    There were no vitals filed for this visit.   Subjective Assessment - 11/16/17 1149    Subjective  Pt has had hip pain for 6 years and had bursitis that comes and goes, not constant.  Now it is on both sides occasionally but worse on the left.  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 Pt haivng difficulty getting on and off the floor and sitting cross-leg.     Limitations  Sitting;House hold activities;Other (comment);Lifting sitting cross legged    How long can you sit comfortably?  2-3 hours    Patient Stated Goals  reduce or get rid of pain, be able to play with grandchildren on the floor    Currently in Pain?  Yes    Pain Score  5  8/10 worst; no pain best    Pain Location  Hip     Pain Orientation  Left    Pain Descriptors / Indicators  Aching;Constant;Sharp    Pain Type  Chronic pain    Pain Radiating Towards  lateral thigh and into the groin    Pain Onset  More than a month ago    Pain Frequency  Intermittent    Aggravating Factors   getting up and down from the floor and carrying grandchildren, vacuuming, sitting for a long drive    Pain Relieving Factors  not sure, alieve    Effect of Pain on Daily Activities  push through all activities    Multiple Pain Sites  No         [facility] PT Assessment - 11/17/17 0001      Assessment   Medical Diagnosis  M25.559 (ICD-10-CM) - Lateral pain of hip; M54.5,G89.29 (ICD-10-CM) - Chronic low back pain without sciatica, unspecified back pain laterality    Referring Provider  Lucretia Kern, DO    Onset Date/Surgical Date  -- 6 years      Precautions   Precautions  None      Restrictions   Weight Bearing Restrictions  No  Balance Screen   Has the patient fallen in the past 6 months  No      [place] residence    Living Arrangements  Spouse/significant other      Prior Function   Level of [place]  Retired      Associate Professor   Overall Cognitive Status  Within Functional Limits for tasks assessed      Observation/Other Assessments   Focus on Therapeutic Outcomes (FOTO)   50% limited      Posture/Postural Control   Posture/Postural Control  Postural limitations    Postural Limitations  Anterior pelvic tilt;Increased lumbar lordosis;Rounded Shoulders      PROM   Overall PROM Comments  Left hip pain with IR and flexion 10% limited      Strength   Right Hip Flexion  4+/5    Right Hip Extension  5/5    Right Hip External Rotation   5/5    Right Hip Internal Rotation  5/5    Right Hip ABduction  4+/5    Right Hip ADduction  4+/5    Left Hip Flexion  4/5    Left Hip Extension  5/5    Left Hip External Rotation  4+/5    Left Hip Internal  Rotation  4+/5    Left Hip ABduction  4/5    Left Hip ADduction  4/5      Flexibility   Soft Tissue Assessment /Muscle Length  yes    Hamstrings  20% limited left; 10% limited right      Palpation   SI assessment   SI tender on left; provocation tests negative    Palpation comment  lumbar paraspinals tight and left SI joint tneder to palpation      Special Tests   Other special tests  stork test instability Left; bilateral scour tests negative; SLR test negative      Ambulation/Gait   Gait Pattern  Within Functional Limits                Objective measurements completed on examination: See above findings.      [place] Adult PT Treatment/Exercise - 11/17/17 0001      Self-Care   Self-Care  Other Self-Care Comments    Other Self-Care Comments   POC and HEP educated and performed             PT Education - 11/16/17 1302    Education Details   Access Code: [email or web address]     Person(s) Educated  Patient    Methods  Explanation;Demonstration;Handout    Comprehension  Verbalized understanding;Returned demonstration       PT Short Term Goals - 11/17/17 0916      PT SHORT TERM GOAL #1   Title  ind with initial HEP    Time  4    Period  Weeks    Status  New    Target Date  12/14/17      PT SHORT TERM GOAL #2   Title  able to sit cross-leg on the floor with grandchildren for caretaking activities.    Time  4    Period  Weeks    Status  New    Target Date  12/14/17        PT Long Term Goals - 11/17/17 0912      PT LONG TERM GOAL #1   Title  pt will demonstrate 5/5 MMT bilateral hip strength  for improved stabilty during functional lifting    Time  8    Period  Weeks    Status  New    Target Date  01/11/18      PT LONG TERM GOAL #2   Title  pt will report 60% reduction of pain during typical week    Time  8    Period  Weeks    Status  New    Target Date  01/11/18      PT LONG TERM GOAL #3   Title  Pt will be able to sit on floor with grandchildren  for at least 30 minuts of playing without increased hip or back pain    Time  8    Period  Weeks    Status  New    Target Date  01/11/18      PT LONG TERM GOAL #4   Title  FOTO < or = to 39% limited    Time  8    Period  Weeks    Status  New    Target Date  01/11/18      PT LONG TERM GOAL #5   Title  ind with advanced HEP    Time  8    Period  Weeks    Status  New    Target Date  01/11/18             Plan - 11/17/17 0805    Clinical Impression Statement  Pt presents to PT due to ongoing hip pain left >right.  She reports some low back discomfort but hip is the main area she feels pain.  Pt has bilateral LE weakness.  She has restriction in soft tissue including glutes, TFL, lumbar paraspinals.  She demonstrates some instability on left SI during stork test.  She has tight and limited hmstrings (Lt>Rt).  Pt has reduced PROM with pain on left hip.  Pt has LE weakness bilaterally.  Pt will benefit from skilled PT to address impairments and return to functional activites.    History and Personal Factors relevant to plan of care:  chronic pain    Clinical Presentation  Stable    Clinical Presentation due to:  pt is stable    Clinical Decision Making  Moderate    Rehab Potential  Excellent    PT Frequency  2x / week    PT Duration  8 weeks    PT Treatment/Interventions  ADLs/Self Care Home Management;Biofeedback;Cryotherapy;Electrical Stimulation;Iontophoresis 4mg /ml Dexamethasone;Moist Heat;Traction;Therapeutic activities;Therapeutic exercise;Neuromuscular re-education;Patient/family education;Manual techniques;Passive range of motion;Dry needling;Taping    PT Next Visit Plan  STM and modalities lumbar and glutes, begin core strength    Consulted and Agree with Plan of Care  Patient       Patient will benefit from skilled therapeutic intervention in order to improve the following deficits and impairments:  Increased muscle spasms, Increased fascial restricitons, Impaired  flexibility, Decreased strength, Pain, Postural dysfunction, Decreased range of motion  Visit Diagnosis: Chronic bilateral low back pain, with sciatica presence unspecified  Pain in left hip  Muscle weakness (generalized)     Problem List Patient Active Problem List   Diagnosis Date Noted  . Hypothyroidism 07/22/2017  . Menopausal hot flushes 07/22/2017  . Snoring 08/07/2015  . Rhinitis, chronic 08/07/2015  . Bereavement 05/07/2015  . Melanoma of skin ([place]) 05/07/2015  . Abnormal ECG during exercise stress test 05/01/2015  . Hyperlipidemia 05/01/2015    Zannie Cove, PT 11/17/2017, 9:18 AM  [facility] [address] [place], [name], [address] Phone: [phone]   Fax:  [phone]  Name: Olivia Morrison MRN: [id number] Date of Birth: Aug 07, 1950

## 2017-11-16 NOTE — Patient Instructions (Signed)
Access Code: [email or web address]  URL: [email or web address]  Date: 11/16/2017  Prepared by: Lovett Calender   Exercises  Supine Hamstring Stretch with Strap - 10 reps - 3 sets - 1x daily - 7x weekly

## 2017-11-17 NOTE — Addendum Note (Signed)
Addended by: Lovett Calender D on: 11/17/2017 09:22 AM   Modules accepted: Orders

## 2017-11-19 ENCOUNTER — Ambulatory Visit: Payer: Medicare HMO | Admitting: Physical Therapy

## 2017-11-19 ENCOUNTER — Ambulatory Visit: Payer: Medicare HMO | Admitting: Family Medicine

## 2017-11-19 ENCOUNTER — Encounter: Payer: Self-pay | Admitting: Physical Therapy

## 2017-11-19 ENCOUNTER — Ambulatory Visit (INDEPENDENT_AMBULATORY_CARE_PROVIDER_SITE_OTHER): Payer: Medicare HMO | Admitting: Family Medicine

## 2017-11-19 ENCOUNTER — Encounter: Payer: Self-pay | Admitting: Family Medicine

## 2017-11-19 VITALS — BP 120/80 | HR 76 | Temp 98.1°F | Ht 64.0 in | Wt 145.7 lb

## 2017-11-19 DIAGNOSIS — R232 Flushing: Secondary | ICD-10-CM | POA: Diagnosis not present

## 2017-11-19 DIAGNOSIS — M9900 Segmental and somatic dysfunction of head region: Secondary | ICD-10-CM | POA: Diagnosis not present

## 2017-11-19 DIAGNOSIS — M9902 Segmental and somatic dysfunction of thoracic region: Secondary | ICD-10-CM

## 2017-11-19 DIAGNOSIS — M9903 Segmental and somatic dysfunction of lumbar region: Secondary | ICD-10-CM

## 2017-11-19 DIAGNOSIS — M9906 Segmental and somatic dysfunction of lower extremity: Secondary | ICD-10-CM

## 2017-11-19 DIAGNOSIS — M545 Low back pain: Principal | ICD-10-CM

## 2017-11-19 DIAGNOSIS — M6281 Muscle weakness (generalized): Secondary | ICD-10-CM

## 2017-11-19 DIAGNOSIS — R635 Abnormal weight gain: Secondary | ICD-10-CM | POA: Diagnosis not present

## 2017-11-19 DIAGNOSIS — M9901 Segmental and somatic dysfunction of cervical region: Secondary | ICD-10-CM | POA: Diagnosis not present

## 2017-11-19 DIAGNOSIS — M25552 Pain in left hip: Secondary | ICD-10-CM

## 2017-11-19 DIAGNOSIS — G8929 Other chronic pain: Secondary | ICD-10-CM

## 2017-11-19 DIAGNOSIS — R140 Abdominal distension (gaseous): Secondary | ICD-10-CM | POA: Diagnosis not present

## 2017-11-19 NOTE — Patient Instructions (Addendum)
BEFORE YOU LEAVE: -follow up: 1-2 weeks for OMT  Please contact your gastroenterologist and endocrinologist as we discussed.  Continue the physical therapy.

## 2017-11-19 NOTE — Progress Notes (Signed)
HPI:  Using dictation device. Unfortunately this device frequently misinterprets words/phrases.   Acute visit for OMM for L lateral hip pain: -intermittent, chronic, used to see chiropractor, responded well to OMT in the past -hx trochanteric bursitis and tight L IT band -some low back pain at times - -up and down with grandchildren a lot -at last visit treated with OMM, PT referral, consideration imaging and acupunture -today reports: some soreness following treatment, but now overall doing better, cleaned out attic recently, some mild ache L lat hip  -she is planning to get a hold of her GI doc and endocrine as she has had some hot flashes, loose stools, wt gain and bloating since changing from naturethroid to levothyroxine, has a gyn office visit set up next month     ROS: See pertinent positives and negatives per HPI.  Past Medical History:  Diagnosis Date  . Abnormal stress test    2016, saw cards, nuclear scan planned  . Depression    seeing [name] - psychologist  . GERD (gastroesophageal reflux disease)   . Hyperlipemia   . Hypertension   . Melanoma ([place])    in 2016, removed; seeing Dr. [name]    Past Surgical History:  Procedure Laterality Date  . ABDOMINAL HYSTERECTOMY    . BREAST BIOPSY Right 2015  . TONSILLECTOMY  1959    Family History  Problem Relation Age of Onset  . Alcoholism Father   . Arthritis Mother   . Arthritis Sister   . Arthritis Father   . Breast cancer Unknown        Paternal cousin  . Heart disease Father   . Heart disease Mother   . Hypertension Father   . Hypertension Mother   . Mental illness Maternal Aunt   . Asthma Brother   . Healthy Son     SOCIAL HX: see hpi   Current Outpatient Medications:  .  Acetylcysteine (NAC) 600 MG CAPS, Take by mouth., Disp: , Rfl:  .  Cholecalciferol (VITAMIN D3) 5000 units CAPS, Take by mouth., Disp: , Rfl:  .  levothyroxine (SYNTHROID, LEVOTHROID) 88 MCG tablet, Take 1 tablet (88 mcg  total) by mouth daily., Disp: 90 tablet, Rfl: 3 .  MAGNESIUM CITRATE PO, Take 150 mg by mouth., Disp: , Rfl:  .  Menaquinone-7 (VITAMIN K2 PO), Take by mouth., Disp: , Rfl:  .  Multiple Vitamins-Minerals (ZINC PO), Take by mouth., Disp: , Rfl:  .  NON FORMULARY, Relizen herbal supplement for hot flashes, Disp: , Rfl:  .  Nutritional Supplements (JUICE PLUS FIBRE PO), Take by mouth., Disp: , Rfl:  .  OVER THE COUNTER MEDICATION, Berberine, Disp: , Rfl:  .  THEANINE PO, Take by mouth., Disp: , Rfl:  .  VITAMIN E PO, Take 400 mg by mouth daily., Disp: , Rfl:  .  zinc gluconate 50 MG tablet, Take 50 mg by mouth daily., Disp: , Rfl:   EXAM:  Vitals:   11/19/17 1236  BP: 120/80  Pulse: 76  Temp: 98.1 F (36.7 C)    Body mass index is 25.01 kg/m.  GENERAL: vitals reviewed and listed above, alert, oriented, appears well hydrated and in no acute distress  HEENT: atraumatic, conjunttiva clear, no obvious abnormalities on inspection of external nose and ears  NECK: no obvious masses on inspection  MS: moves all extremities without noticeable abnormality, T 3 E RlSr, sub occ muscle tension, R temp int Rot, L ITB TP, L hip ext rotated, L 3-5  RlSr  PSYCH: pleasant and cooperative, no obvious depression or anxiety  ASSESSMENT AND PLAN:  Discussed the following assessment and plan:  Pain of left hip joint -offered imaging given ongoing symptoms, declined as overall a bit better -opted to do OMM (see below) and cont PT  Bloating Hot flashes Weight gain -admits to being out of shape, no regular exercise -advised healthy diet and exercise - she is working on diet and is trying to eliminate dairy and gluten as has hx intolerance, but has not entirely eliminated - if not improving over next week she agrees to see her GI doc -she plans to follow up with Dr. Cruzita Lederer about her concerns with levothyroxine  PROCEDURE NOTE : OSTEOPATHIC TREATMENT The decision today to treat with gentle  Osteopathic Manipulative Therapy  (OMT) was based on physical exam findings, diagnoses and patient wishes. Verbal consent was obtained after after explanation of risks and benefits. No Cervical HVLA manipulation was performed. After consent was obtained, treatment was  performed as below:      Regions treated:  Head, cervical, thor, lumbar, LE     Techniques used: muscle energy, counterstrain, BMT, Cranial, MR The patient tolerated the treatment well and reported Improved  symptoms following treatment today. Follow up treatment was advised in: 1-2 weeks  -Patient advised to return or notify a doctor immediately if symptoms worsen or persist or new concerns arise.  Patient Instructions  BEFORE YOU LEAVE: -follow up: 1-2 weeks for OMT  Please contact your gastroenterologist and endocrinologist as we discussed.  Continue the physical therapy.    Lucretia Kern, DO

## 2017-11-19 NOTE — Therapy (Signed)
[facility] [address] [place], [name], [address] Phone: [phone]   Fax:  [phone]  Physical Therapy Treatment  Patient Details  Name: Olivia Morrison MRN: [id number] Date of Birth: March 23, 1951 Referring Provider: Lucretia Kern, DO   Encounter Date: 11/19/2017  PT End of Session - 11/19/17 1400    Visit Number  2    Date for PT Re-Evaluation  01/11/18    Authorization Type  medicare    PT Start Time  1400    PT Stop Time  1444    PT Time Calculation (min)  44 min    Activity Tolerance  Patient tolerated treatment well    Behavior During Therapy  [facility] for tasks assessed/performed       Past Medical History:  Diagnosis Date  . Abnormal stress test    2016, saw cards, nuclear scan planned  . Depression    seeing [name] - psychologist  . GERD (gastroesophageal reflux disease)   . Hyperlipemia   . Hypertension   . Melanoma ([place])    in 2016, removed; seeing Dr. [name]    Past Surgical History:  Procedure Laterality Date  . ABDOMINAL HYSTERECTOMY    . BREAST BIOPSY Right 2015  . TONSILLECTOMY  1959    There were no vitals filed for this visit.  Subjective Assessment - 11/19/17 1511    Subjective  Denies pain today.  Pt just came from osteopath MD.    Limitations  Sitting;House hold activities;Other (comment);Lifting    Patient Stated Goals  reduce or get rid of pain, be able to play with grandchildren on the floor    Currently in Pain?  No/denies                       [facility] Adult PT Treatment/Exercise - 11/19/17 0001      Neuro Re-ed    Neuro Re-ed Details   TrA activation      Lumbar Exercises: Stretches   Single Knee to Chest Stretch  5 reps;10 seconds    Lower Trunk Rotation  3 reps      Lumbar Exercises: Supine   Ab Set  5 reps;5 seconds    Clam  15 reps    Clam Limitations  yellow band    Bent Knee Raise  10 reps;3 seconds    Other Supine Lumbar Exercises  knee drop  out      Manual Therapy   Manual Therapy  Soft tissue mobilization;Myofascial release    Soft tissue mobilization  lumbar paraspinals, sacral distraction, left glutes along sacrum               PT Short Term Goals - 11/17/17 0916      PT SHORT TERM GOAL #1   Title  ind with initial HEP    Time  4    Period  Weeks    Status  New    Target Date  12/14/17      PT SHORT TERM GOAL #2   Title  able to sit cross-leg on the floor with grandchildren for caretaking activities.    Time  4    Period  Weeks    Status  New    Target Date  12/14/17        PT Long Term Goals - 11/17/17 0912      PT LONG TERM GOAL #1   Title  pt will demonstrate 5/5 MMT bilateral hip strength  for improved stabilty during functional lifting    Time  8    Period  Weeks    Status  New    Target Date  01/11/18      PT LONG TERM GOAL #2   Title  pt will report 60% reduction of pain during typical week    Time  8    Period  Weeks    Status  New    Target Date  01/11/18      PT LONG TERM GOAL #3   Title  Pt will be able to sit on floor with grandchildren for at least 30 minuts of playing without increased hip or back pain    Time  8    Period  Weeks    Status  New    Target Date  01/11/18      PT LONG TERM GOAL #4   Title  FOTO < or = to 39% limited    Time  8    Period  Weeks    Status  New    Target Date  01/11/18      PT LONG TERM GOAL #5   Title  ind with advanced HEP    Time  8    Period  Weeks    Status  New    Target Date  01/11/18            Plan - 11/19/17 1401    Clinical Impression Statement  Pt did well with exercises and starting core strengthening.  Pt did not have any pain and was monitored with tactile cues for TrA.  Pt has tight lumbar paraspinals and had increased soft tissue length after manual techniques.  Pt will benefit from skilled PT to continue working towards functional goals.     PT Treatment/Interventions  ADLs/Self Care Home  Management;Biofeedback;Cryotherapy;Electrical Stimulation;Iontophoresis 4mg /ml Dexamethasone;Moist Heat;Traction;Therapeutic activities;Therapeutic exercise;Neuromuscular re-education;Patient/family education;Manual techniques;Passive range of motion;Dry needling;Taping    PT Next Visit Plan  STM and modalities lumbar and glutes, progress core strength, try cross leg with pillow     Consulted and Agree with Plan of Care  Patient       Patient will benefit from skilled therapeutic intervention in order to improve the following deficits and impairments:  Increased muscle spasms, Increased fascial restricitons, Impaired flexibility, Decreased strength, Pain, Postural dysfunction, Decreased range of motion  Visit Diagnosis: Chronic bilateral low back pain, with sciatica presence unspecified  Pain in left hip  Muscle weakness (generalized)     Problem List Patient Active Problem List   Diagnosis Date Noted  . Hypothyroidism 07/22/2017  . Menopausal hot flushes 07/22/2017  . Snoring 08/07/2015  . Rhinitis, chronic 08/07/2015  . Bereavement 05/07/2015  . Melanoma of skin ([place]) 05/07/2015  . Abnormal ECG during exercise stress test 05/01/2015  . Hyperlipidemia 05/01/2015    Zannie Cove, PT 11/19/2017, 3:12 PM  [facility] [address], [place], [name], [address] Phone: [phone]   Fax:  [phone]  Name: Ernest Orr MRN: [id number] Date of Birth: [birth year]

## 2017-11-24 ENCOUNTER — Ambulatory Visit: Payer: Medicare HMO | Admitting: Physical Therapy

## 2017-11-24 DIAGNOSIS — M6281 Muscle weakness (generalized): Secondary | ICD-10-CM

## 2017-11-24 DIAGNOSIS — M25552 Pain in left hip: Secondary | ICD-10-CM | POA: Diagnosis not present

## 2017-11-24 DIAGNOSIS — M545 Low back pain: Principal | ICD-10-CM

## 2017-11-24 DIAGNOSIS — G8929 Other chronic pain: Secondary | ICD-10-CM

## 2017-11-24 NOTE — Patient Instructions (Signed)
Access Code: [email or web address]  URL: [email or web address]  Date: 11/24/2017  Prepared by: Lovett Calender   Exercises  Supine Hamstring Stretch with Strap - 10 reps - 3 sets - 1x daily - 7x weekly  Supine Transversus Abdominis Bracing - Hands on Stomach - 10 reps - 1 sets - 5 sec hold - 1x daily - 7x weekly  Supine March - 10 reps - 3 sets - 1x daily - 7x weekly  Hooklying Isometric Clamshell - 10 reps - 3 sets - 1x daily - 7x weekly  Sidelying Hip Abduction - 5 reps - 3 sets - 1x daily - 7x weekly  Supine Bridge with Resistance Band - 10 reps - 2 sets - 1x daily - 7x weekly  Supine Piriformis Stretch - 3 reps - 1 sets - 30 sec hold - 1x daily - 7x weekly   Philip [address], [place], [place] [address] Phone # [phone] Fax [phone]

## 2017-11-24 NOTE — Therapy (Signed)
[facility] [address], [place], [name], [address] Phone: [phone]   Fax:  [phone]  Physical Therapy Treatment  Patient Details  Name: Olivia Morrison MRN: [id number] Date of Birth: 1950-09-03 Referring Provider: Lucretia Kern, DO   Encounter Date: 11/24/2017  PT End of Session - 11/24/17 1014    Visit Number  3    Date for PT Re-Evaluation  01/11/18    Authorization Type  medicare    PT Start Time  0932    PT Stop Time  1014    PT Time Calculation (min)  42 min    Activity Tolerance  Patient tolerated treatment well    Behavior During Therapy  [facility] for tasks assessed/performed       Past Medical History:  Diagnosis Date  . Abnormal stress test    2016, saw cards, nuclear scan planned  . Depression    seeing [name] - psychologist  . GERD (gastroesophageal reflux disease)   . Hyperlipemia   . Hypertension   . Melanoma ([place])    in 2016, removed; seeing Dr. [name]    Past Surgical History:  Procedure Laterality Date  . ABDOMINAL HYSTERECTOMY    . BREAST BIOPSY Right 2015  . TONSILLECTOMY  1959    There were no vitals filed for this visit.  Subjective Assessment - 11/24/17 0936    Subjective  I have been feeling good.  Feeling less pain since last week.      Currently in Pain?  No/denies                       [facility] Adult PT Treatment/Exercise - 11/24/17 0001      Neuro Re-ed    Neuro Re-ed Details   TrA activation      Lumbar Exercises: Seated   Hip Flexion on Ball  Strengthening;Both;20 reps    Hip Flexion on Ball Limitations  LE and UE marching, then alternating with 2lb weight    Other Seated Lumbar Exercises  sitting on ball UE ext 15 lb - 20x    Other Seated Lumbar Exercises  standing hip ext kick back green band; hip abduction yellow band - 2x 10 each             PT Education - 11/24/17 1008    Education Details   Access Code: [email or web address]     Person(s)  Educated  Patient    Methods  Explanation;Demonstration;Handout    Comprehension  Verbalized understanding;Returned demonstration       PT Short Term Goals - 11/24/17 1014      PT SHORT TERM GOAL #1   Title  ind with initial HEP    Time  4    Period  Weeks    Status  Achieved      PT SHORT TERM GOAL #2   Title  able to sit cross-leg on the floor with grandchildren for caretaking activities.    Baseline  slight pain but able to do    Time  4    Period  Weeks    Status  Achieved        PT Long Term Goals - 11/17/17 0912      PT LONG TERM GOAL #1   Title  pt will demonstrate 5/5 MMT bilateral hip strength for improved stabilty during functional lifting    Time  8    Period  Weeks    Status  New  Target Date  01/11/18      PT LONG TERM GOAL #2   Title  pt will report 60% reduction of pain during typical week    Time  8    Period  Weeks    Status  New    Target Date  01/11/18      PT LONG TERM GOAL #3   Title  Pt will be able to sit on floor with grandchildren for at least 30 minuts of playing without increased hip or back pain    Time  8    Period  Weeks    Status  New    Target Date  01/11/18      PT LONG TERM GOAL #4   Title  FOTO < or = to 39% limited    Time  8    Period  Weeks    Status  New    Target Date  01/11/18      PT LONG TERM GOAL #5   Title  ind with advanced HEP    Time  8    Period  Weeks    Status  New    Target Date  01/11/18            Plan - 11/24/17 0942    Clinical Impression Statement  Pt did good job maintaining alignment with cues during treatment today.  No increased pain or hip popping was noted.  Pt cued for TrA activation thrhought exercises.  Pt will benefit from skilled PT to progress hip and core strength    PT Treatment/Interventions  ADLs/Self Care Home Management;Biofeedback;Cryotherapy;Electrical Stimulation;Iontophoresis 4mg /ml Dexamethasone;Moist Heat;Traction;Therapeutic activities;Therapeutic  exercise;Neuromuscular re-education;Patient/family education;Manual techniques;Passive range of motion;Dry needling;Taping    PT Next Visit Plan  STM and modalities lumbar and glutes, progress core and hip strength    PT Home Exercise Plan  Access Code: [email or web address]    Consulted and Agree with Plan of Care  Patient       Patient will benefit from skilled therapeutic intervention in order to improve the following deficits and impairments:  Increased muscle spasms, Increased fascial restricitons, Impaired flexibility, Decreased strength, Pain, Postural dysfunction, Decreased range of motion  Visit Diagnosis: Chronic bilateral low back pain, with sciatica presence unspecified  Pain in left hip  Muscle weakness (generalized)     Problem List Patient Active Problem List   Diagnosis Date Noted  . Hypothyroidism 07/22/2017  . Menopausal hot flushes 07/22/2017  . Snoring 08/07/2015  . Rhinitis, chronic 08/07/2015  . Bereavement 05/07/2015  . Melanoma of skin ([place]) 05/07/2015  . Abnormal ECG during exercise stress test 05/01/2015  . Hyperlipidemia 05/01/2015    Zannie Cove, PT 11/24/2017, 10:19 AM  [facility] [address], [place], [name], [address] Phone: [phone]   Fax:  [phone]  Name: Olivia Morrison MRN: [id number] Date of Birth: Oct 24, 1950

## 2017-11-26 ENCOUNTER — Ambulatory Visit: Payer: Medicare HMO | Admitting: Physical Therapy

## 2017-11-26 ENCOUNTER — Encounter: Payer: Self-pay | Admitting: Physical Therapy

## 2017-11-26 DIAGNOSIS — M545 Low back pain: Principal | ICD-10-CM

## 2017-11-26 DIAGNOSIS — G8929 Other chronic pain: Secondary | ICD-10-CM

## 2017-11-26 DIAGNOSIS — M25552 Pain in left hip: Secondary | ICD-10-CM

## 2017-11-26 DIAGNOSIS — M6281 Muscle weakness (generalized): Secondary | ICD-10-CM | POA: Diagnosis not present

## 2017-11-26 NOTE — Therapy (Signed)
[facility] [address], [place], [name], [address] Phone: [phone]   Fax:  [phone]  Physical Therapy Treatment  Patient Details  Name: Olivia Morrison MRN: [id number] Date of Birth: Jun 29, 1950 Referring Provider: Lucretia Kern, DO   Encounter Date: 11/26/2017  PT End of Session - 11/26/17 1231    Visit Number  4    Date for PT Re-Evaluation  01/11/18    Authorization Type  medicare    PT Start Time  1231    PT Stop Time  1314    PT Time Calculation (min)  43 min    Activity Tolerance  Patient tolerated treatment well    Behavior During Therapy  [facility] for tasks assessed/performed       Past Medical History:  Diagnosis Date  . Abnormal stress test    2016, saw cards, nuclear scan planned  . Depression    seeing [name] - psychologist  . GERD (gastroesophageal reflux disease)   . Hyperlipemia   . Hypertension   . Melanoma ([place])    in 2016, removed; seeing Dr. [name]    Past Surgical History:  Procedure Laterality Date  . ABDOMINAL HYSTERECTOMY    . BREAST BIOPSY Right 2015  . TONSILLECTOMY  1959    There were no vitals filed for this visit.  Subjective Assessment - 11/26/17 1235    Subjective  I had a lot of pain after last time I saw you.  I think it was one of the exercises.    Limitations  Sitting;House hold activities;Other (comment);Lifting    Patient Stated Goals  reduce or get rid of pain, be able to play with grandchildren on the floor    Currently in Pain?  Yes    Pain Score  5     Pain Location  Back    Pain Orientation  Right    Pain Descriptors / Indicators  Aching    Pain Type  Acute pain    Pain Onset  More than a month ago    Pain Frequency  Intermittent    Multiple Pain Sites  No                       [facility] Adult PT Treatment/Exercise - 11/26/17 0001      Lumbar Exercises: Stretches   Piriformis Stretch  Right;Left;3 reps;20 seconds    Other Lumbar  Stretch Exercise  sidelying rotatoin - open book - 5 x each way      Lumbar Exercises: Supine   Clam  10 reps    Clam Limitations  red band    Bridge  10 reps;5 seconds red band      Lumbar Exercises: Prone   Single Arm Raise  10 reps    Straight Leg Raise  10 reps    Opposite Arm/Leg Raise  5 reps    Other Prone Lumbar Exercises  all prone on pball with tactile cues to prevent excess lumbar activation      Manual Therapy   Soft tissue mobilization  lumbar paraspinals, sacral distraction, left glutes along sacrum; right glutes, right QL, right paraspinals T10-S1               PT Short Term Goals - 11/24/17 1014      PT SHORT TERM GOAL #1   Title  ind with initial HEP    Time  4    Period  Weeks    Status  Achieved      PT SHORT TERM GOAL #2   Title  able to sit cross-leg on the floor with grandchildren for caretaking activities.    Baseline  slight pain but able to do    Time  4    Period  Weeks    Status  Achieved        PT Long Term Goals - 11/17/17 0912      PT LONG TERM GOAL #1   Title  pt will demonstrate 5/5 MMT bilateral hip strength for improved stabilty during functional lifting    Time  8    Period  Weeks    Status  New    Target Date  01/11/18      PT LONG TERM GOAL #2   Title  pt will report 60% reduction of pain during typical week    Time  8    Period  Weeks    Status  New    Target Date  01/11/18      PT LONG TERM GOAL #3   Title  Pt will be able to sit on floor with grandchildren for at least 30 minuts of playing without increased hip or back pain    Time  8    Period  Weeks    Status  New    Target Date  01/11/18      PT LONG TERM GOAL #4   Title  FOTO < or = to 39% limited    Time  8    Period  Weeks    Status  New    Target Date  01/11/18      PT LONG TERM GOAL #5   Title  ind with advanced HEP    Time  8    Period  Weeks    Status  New    Target Date  01/11/18            Plan - 11/26/17 1332    Clinical  Impression Statement  Pt doing well but had a flare up due to exercises last time causing increased muscle spasms.  She responded well to [facility]. Pt did well with exercises today and reviewed HEP to ensure she is not doing too much to flare up back and hip.  Skilled PT needed to progress core and hip strength for improved functional activites.    PT Treatment/Interventions  ADLs/Self Care Home Management;Biofeedback;Cryotherapy;Electrical Stimulation;Iontophoresis 4mg /ml Dexamethasone;Moist Heat;Traction;Therapeutic activities;Therapeutic exercise;Neuromuscular re-education;Patient/family education;Manual techniques;Passive range of motion;Dry needling;Taping    PT Next Visit Plan  STM and modalities lumbar and glutes, progress core and hip strength to standing hip and step ups    PT Home Exercise Plan  Access Code: [email or web address] add open book and hip fexor stretch next    Consulted and Agree with Plan of Care  Patient       Patient will benefit from skilled therapeutic intervention in order to improve the following deficits and impairments:  Increased muscle spasms, Increased fascial restricitons, Impaired flexibility, Decreased strength, Pain, Postural dysfunction, Decreased range of motion  Visit Diagnosis: Chronic bilateral low back pain, with sciatica presence unspecified  Pain in left hip  Muscle weakness (generalized)     Problem List Patient Active Problem List   Diagnosis Date Noted  . Hypothyroidism 07/22/2017  . Menopausal hot flushes 07/22/2017  . Snoring 08/07/2015  . Rhinitis, chronic 08/07/2015  . Bereavement 05/07/2015  . Melanoma of skin ([place]) 05/07/2015  . Abnormal ECG during exercise stress test 05/01/2015  .  Hyperlipidemia 05/01/2015    Zannie Cove, PT 11/26/2017, 1:38 PM  [facility] [address], [place], [name], [address] Phone: [phone]   Fax:  [phone]  Name: Olivia Morrison MRN:  [id number] Date of Birth: [birth year]

## 2017-12-01 ENCOUNTER — Encounter: Payer: Medicare HMO | Admitting: Physical Therapy

## 2017-12-03 ENCOUNTER — Ambulatory Visit: Payer: Medicare HMO | Admitting: Family Medicine

## 2017-12-03 ENCOUNTER — Ambulatory Visit: Payer: Medicare HMO | Admitting: Physical Therapy

## 2017-12-03 DIAGNOSIS — G8929 Other chronic pain: Secondary | ICD-10-CM | POA: Diagnosis not present

## 2017-12-03 DIAGNOSIS — M6281 Muscle weakness (generalized): Secondary | ICD-10-CM | POA: Diagnosis not present

## 2017-12-03 DIAGNOSIS — M545 Low back pain: Secondary | ICD-10-CM | POA: Diagnosis not present

## 2017-12-03 DIAGNOSIS — M25552 Pain in left hip: Secondary | ICD-10-CM | POA: Diagnosis not present

## 2017-12-03 NOTE — Therapy (Signed)
[facility] [address], [place], [name], [address] Phone: [phone]   Fax:  [phone]  Physical Therapy Treatment  Patient Details  Name: Olivia Morrison MRN: [id number] Date of Birth: 1951/01/13 Referring Provider: Lucretia Kern, DO   Encounter Date: 12/03/2017  PT End of Session - 12/03/17 1647    Visit Number  5    Date for PT Re-Evaluation  01/11/18    Authorization Type  medicare    PT Start Time  1401    PT Stop Time  1441    PT Time Calculation (min)  40 min    Activity Tolerance  Patient tolerated treatment well    Behavior During Therapy  [facility] for tasks assessed/performed       Past Medical History:  Diagnosis Date  . Abnormal stress test    2016, saw cards, nuclear scan planned  . Depression    seeing [name] - psychologist  . GERD (gastroesophageal reflux disease)   . Hyperlipemia   . Hypertension   . Melanoma ([place])    in 2016, removed; seeing Dr. [name]    Past Surgical History:  Procedure Laterality Date  . ABDOMINAL HYSTERECTOMY    . BREAST BIOPSY Right 2015  . TONSILLECTOMY  1959    There were no vitals filed for this visit.  Subjective Assessment - 12/03/17 1405    Subjective  Just a little pain in the back on the right side.      Currently in Pain?  Yes    Pain Score  4                        [facility] Adult PT Treatment/Exercise - 12/03/17 0001      Lumbar Exercises: Stretches   Piriformis Stretch  Right;Left;3 reps;20 seconds    Other Lumbar Stretch Exercise  internal rotation hip stretch      Lumbar Exercises: Standing   Other Standing Lumbar Exercises  hip series - 4 ways - yellow band - 10x each      Lumbar Exercises: Supine   Large Ball Abdominal Isometric  20 reps ball roll out    Other Supine Lumbar Exercises  hip series 4 ways on mat - 10x each      Modalities   Modalities  --      Traction   Type of Traction  --    Min (lbs)  --    Max  (lbs)  --    Hold Time  --    Rest Time  --    Time  --      Manual Therapy   Manual Therapy  Muscle Energy Technique    Soft tissue mobilization  left glutes    Muscle Energy Technique  to correct left anterior rotation - isometric extension 20 sec x 3; then abductoin 10x               PT Short Term Goals - 11/24/17 1014      PT SHORT TERM GOAL #1   Title  ind with initial HEP    Time  4    Period  Weeks    Status  Achieved      PT SHORT TERM GOAL #2   Title  able to sit cross-leg on the floor with grandchildren for caretaking activities.    Baseline  slight pain but able to do    Time  4    Period  Weeks    Status  Achieved        PT Long Term Goals - 11/17/17 0912      PT LONG TERM GOAL #1   Title  pt will demonstrate 5/5 MMT bilateral hip strength for improved stabilty during functional lifting    Time  8    Period  Weeks    Status  New    Target Date  01/11/18      PT LONG TERM GOAL #2   Title  pt will report 60% reduction of pain during typical week    Time  8    Period  Weeks    Status  New    Target Date  01/11/18      PT LONG TERM GOAL #3   Title  Pt will be able to sit on floor with grandchildren for at least 30 minuts of playing without increased hip or back pain    Time  8    Period  Weeks    Status  New    Target Date  01/11/18      PT LONG TERM GOAL #4   Title  FOTO < or = to 39% limited    Time  8    Period  Weeks    Status  New    Target Date  01/11/18      PT LONG TERM GOAL #5   Title  ind with advanced HEP    Time  8    Period  Weeks    Status  New    Target Date  01/11/18            Plan - 12/03/17 1639    Clinical Impression Statement  Pt is doing well.  She continue to get some muscle spasm in glutes.  She hasn't had the increased level of pain but continues to feel very fatigued with exercises.  Pt had very mild anterior pelvic rotation of left side which was corrected with MET.  Pt will benefit from skilled PT  to progress core and hip strength and ROM for improved function.    Rehab Potential  Excellent    PT Treatment/Interventions  ADLs/Self Care Home Management;Biofeedback;Cryotherapy;Electrical Stimulation;Iontophoresis '4mg'$ /ml Dexamethasone;Moist Heat;Traction;Therapeutic activities;Therapeutic exercise;Neuromuscular re-education;Patient/family education;Manual techniques;Passive range of motion;Dry needling;Taping    PT Next Visit Plan  step ups, standing core and hip, squats, hip extension with band, hip series with band, open book and hip flexor stretch, prone and sitting on ball    PT Home Exercise Plan  Access Code: [email or web address]    Consulted and Agree with Plan of Care  Patient       Patient will benefit from skilled therapeutic intervention in order to improve the following deficits and impairments:  Increased muscle spasms, Increased fascial restricitons, Impaired flexibility, Decreased strength, Pain, Postural dysfunction, Decreased range of motion  Visit Diagnosis: Chronic bilateral low back pain, with sciatica presence unspecified  Pain in left hip  Muscle weakness (generalized)     Problem List Patient Active Problem List   Diagnosis Date Noted  . Hypothyroidism 07/22/2017  . Menopausal hot flushes 07/22/2017  . Snoring 08/07/2015  . Rhinitis, chronic 08/07/2015  . Bereavement 05/07/2015  . Melanoma of skin ([place]) 05/07/2015  . Abnormal ECG during exercise stress test 05/01/2015  . Hyperlipidemia 05/01/2015    Zannie Cove, PT 12/03/2017, 4:49 PM  [facility] [address], [place], [name], [address] Phone: [phone]   Fax:  [phone]  Name: Olivia Morrison MRN: [id number] Date of Birth: [birth year]

## 2017-12-08 ENCOUNTER — Ambulatory Visit: Payer: Medicare HMO | Admitting: Physical Therapy

## 2017-12-08 DIAGNOSIS — M545 Low back pain: Principal | ICD-10-CM

## 2017-12-08 DIAGNOSIS — M6281 Muscle weakness (generalized): Secondary | ICD-10-CM | POA: Diagnosis not present

## 2017-12-08 DIAGNOSIS — R0683 Snoring: Secondary | ICD-10-CM | POA: Diagnosis not present

## 2017-12-08 DIAGNOSIS — G8929 Other chronic pain: Secondary | ICD-10-CM

## 2017-12-08 DIAGNOSIS — M25552 Pain in left hip: Secondary | ICD-10-CM | POA: Diagnosis not present

## 2017-12-08 DIAGNOSIS — H903 Sensorineural hearing loss, bilateral: Secondary | ICD-10-CM | POA: Diagnosis not present

## 2017-12-08 NOTE — Therapy (Signed)
[facility] [address] [place], [name], [address] Phone: [phone]   Fax:  [phone]  Physical Therapy Treatment  Patient Details  Name: Olivia Morrison MRN: [id number] Date of Birth: 1951/05/23 Referring Provider: Lucretia Kern, DO   Encounter Date: 12/08/2017  PT End of Session - 12/08/17 1239    Visit Number  6    Date for PT Re-Evaluation  01/11/18    Authorization Type  medicare    PT Start Time  1232    PT Stop Time  1313    PT Time Calculation (min)  41 min    Activity Tolerance  Patient tolerated treatment well    Behavior During Therapy  [facility] for tasks assessed/performed       Past Medical History:  Diagnosis Date  . Abnormal stress test    2016, saw cards, nuclear scan planned  . Depression    seeing [name] - psychologist  . GERD (gastroesophageal reflux disease)   . Hyperlipemia   . Hypertension   . Melanoma ([place])    in 2016, removed; seeing Dr. [name]    Past Surgical History:  Procedure Laterality Date  . ABDOMINAL HYSTERECTOMY    . BREAST BIOPSY Right 2015  . TONSILLECTOMY  1959    There were no vitals filed for this visit.  Subjective Assessment - 12/08/17 1235    Subjective  I did some easy exercises and I am feeling sore today.  It is hurting less often, but still gets irritated and not sure why    Limitations  Sitting;House hold activities;Other (comment);Lifting    Patient Stated Goals  reduce or get rid of pain, be able to play with grandchildren on the floor    Currently in Pain?  Yes    Pain Score  8     Pain Location  Hip    Pain Orientation  Left    Pain Descriptors / Indicators  Aching    Pain Type  Acute pain    Pain Radiating Towards  lateral thigh and sometimes in bed the front of the thigh is aching    Pain Onset  More than a month ago    Multiple Pain Sites  No                       [facility] Adult PT Treatment/Exercise - 12/08/17 0001       Self-Care   Other Self-Care Comments   educated in foam roll and fascial release roller to IT band      Lumbar Exercises: Aerobic   Nustep  L1 x 6 min      Manual Therapy   Manual Therapy  Joint mobilization    Joint Mobilization  lateral mobs in right sidelying L2-3; left hip lateral and inferior glides    Soft tissue mobilization  left TFL, glutes, IT band        piriformis stretch, single knee to chest stretch - 2 x 30 sec       PT Short Term Goals - 11/24/17 1014      PT SHORT TERM GOAL #1   Title  ind with initial HEP    Time  4    Period  Weeks    Status  Achieved      PT SHORT TERM GOAL #2   Title  able to sit cross-leg on the floor with grandchildren for caretaking activities.    Baseline  slight pain but  able to do    Time  4    Period  Weeks    Status  Achieved        PT Long Term Goals - 12/08/17 1359      PT LONG TERM GOAL #2   Title  pt will report 60% reduction of pain during typical week    Time  8    Period  Weeks    Status  On-going      PT LONG TERM GOAL #3   Title  Pt will be able to sit on floor with grandchildren for at least 30 minuts of playing without increased hip or back pain    Time  8    Period  Weeks    Status  On-going      PT LONG TERM GOAL #4   Title  FOTO < or = to 39% limited    Time  8    Period  Weeks    Status  On-going      PT LONG TERM GOAL #5   Title  ind with advanced HEP    Time  8    Period  Weeks    Status  On-going            Plan - 12/08/17 1419    Clinical Impression Statement  Pt had a flare up of pain today, but reports she is having less frequent flare ups.  Pt continues to have increased muscle spasms in TFL and fascial restrictions in IT band causing symptoms of GT bursitis in left hip.  Pt had positive prone knee flexion test on the left side.  Pt responded well to manual therapy with decreased pain after treatment.  Pt will benefit from skilled PT to continue working on increased core and  glutes strength and improved muscle coordination to prevent muscle straining with activities.    PT Treatment/Interventions  ADLs/Self Care Home Management;Biofeedback;Cryotherapy;Electrical Stimulation;Iontophoresis 4mg /ml Dexamethasone;Moist Heat;Traction;Therapeutic activities;Therapeutic exercise;Neuromuscular re-education;Patient/family education;Manual techniques;Passive range of motion;Dry needling;Taping    PT Next Visit Plan  MMT, goals, f/u on pelvic alignment, step ups, standing core and hip, squats, hip extension with band, hip series with band, open book and hip flexor stretch, prone and sitting on ball    PT Home Exercise Plan  Access Code: [email or web address]    Consulted and Agree with Plan of Care  Patient       Patient will benefit from skilled therapeutic intervention in order to improve the following deficits and impairments:  Increased muscle spasms, Increased fascial restricitons, Impaired flexibility, Decreased strength, Pain, Postural dysfunction, Decreased range of motion  Visit Diagnosis: Chronic bilateral low back pain, with sciatica presence unspecified  Pain in left hip  Muscle weakness (generalized)     Problem List Patient Active Problem List   Diagnosis Date Noted  . Hypothyroidism 07/22/2017  . Menopausal hot flushes 07/22/2017  . Snoring 08/07/2015  . Rhinitis, chronic 08/07/2015  . Bereavement 05/07/2015  . Melanoma of skin ([place]) 05/07/2015  . Abnormal ECG during exercise stress test 05/01/2015  . Hyperlipidemia 05/01/2015    Zannie Cove, PT 12/08/2017, 2:32 PM  [facility] [address], [place], [name], [address] Phone: [phone]   Fax:  [phone]  Name: Olivia Morrison MRN: [id number] Date of Birth: 05/23/1951

## 2017-12-14 DIAGNOSIS — H5789 Other specified disorders of eye and adnexa: Secondary | ICD-10-CM | POA: Diagnosis not present

## 2017-12-14 DIAGNOSIS — L905 Scar conditions and fibrosis of skin: Secondary | ICD-10-CM | POA: Diagnosis not present

## 2017-12-14 DIAGNOSIS — D492 Neoplasm of unspecified behavior of bone, soft tissue, and skin: Secondary | ICD-10-CM | POA: Diagnosis not present

## 2017-12-14 DIAGNOSIS — D485 Neoplasm of uncertain behavior of skin: Secondary | ICD-10-CM | POA: Diagnosis not present

## 2017-12-14 DIAGNOSIS — H02411 Mechanical ptosis of right eyelid: Secondary | ICD-10-CM | POA: Diagnosis not present

## 2017-12-14 DIAGNOSIS — H02412 Mechanical ptosis of left eyelid: Secondary | ICD-10-CM | POA: Diagnosis not present

## 2017-12-14 DIAGNOSIS — D2210 Melanocytic nevi of unspecified eyelid, including canthus: Secondary | ICD-10-CM | POA: Diagnosis not present

## 2017-12-14 DIAGNOSIS — H02413 Mechanical ptosis of bilateral eyelids: Secondary | ICD-10-CM | POA: Diagnosis not present

## 2017-12-16 ENCOUNTER — Encounter: Payer: Medicare HMO | Admitting: Physical Therapy

## 2017-12-18 ENCOUNTER — Encounter: Payer: Self-pay | Admitting: Gynecology

## 2017-12-18 ENCOUNTER — Ambulatory Visit: Payer: Medicare HMO | Admitting: Gynecology

## 2017-12-18 VITALS — BP 118/76 | Ht 63.5 in | Wt 142.0 lb

## 2017-12-18 DIAGNOSIS — N952 Postmenopausal atrophic vaginitis: Secondary | ICD-10-CM

## 2017-12-18 DIAGNOSIS — N951 Menopausal and female climacteric states: Secondary | ICD-10-CM

## 2017-12-18 DIAGNOSIS — R69 Illness, unspecified: Secondary | ICD-10-CM | POA: Diagnosis not present

## 2017-12-18 MED ORDER — ESTRADIOL 0.0375 MG/24HR TD PTWK: 0.0375 mg | MEDICATED_PATCH | TRANSDERMAL | 12 refills | Status: DC

## 2017-12-18 NOTE — Progress Notes (Signed)
    Olivia Morrison 1951/02/10 [id number]        67 y.o.  [id number] new patient presents to discuss menopausal symptoms to include hot flushes and night sweats.  History of TAH 1993 for leiomyoma and bleeding.  Had been followed by an integrative medicine provider and had been on estradiol patch as well as progesterone and testosterone supplementations.  Developed significant mastalgia and ultimately discontinued all of her medications.  Since then her mastalgia has resolved but she has developed significant hot flushes and night sweats.  Had a trial of Effexor but had vivid disturbing dreams.  Currently is being seen by Dr. Cruzita Lederer for hypothyroidism and Dr. Maudie Mercury for general health maintenance.  Most recent Pap smear/HPV 03/2017.  Mammography 03/2017  Past medical history,surgical history, problem list, medications, allergies, family history and social history were all reviewed and documented in the EPIC chart.  Directed ROS with pertinent positives and negatives documented in the history of present illness/assessment and plan.  Exam: Olivia Morrison assistant Vitals:   12/18/17 1436  BP: 118/76  Weight: 142 lb (64.4 kg)  Height: 5' 3.5" (1.613 m)   General appearance:  Normal HEENT with recent eyelid surgery.  Steri-Strips in place Abdomen soft nontender without masses guarding rebound Breasts examined lying and sitting without masses retractions discharge adenopathy Pelvic external BUS vagina with atrophic changes.  Bimanual exam without masses or tenderness Rectal exam is normal  Assessment/Plan:  67 y.o. [id number] with hot flushes and sweats which are life altering to the patient.  Had been on HRT through integrative medicine to include estradiol progesterone and testosterone.  Discontinued due to mastalgia and now with unacceptable symptoms.  We reviewed options for management to include OTC products, pharmacologic nonhormonal and HRT.  We discussed the various studies associated with HRT and the  risks versus benefits.  Thrombosis such as stroke heart attack DVT as well as breast cancer risks reviewed.  Benefits to include symptom relief, cardiovascular and bone health were also discussed.  Benefits of transdermal over oral, first-pass effect and thrombosis risk also discussed.  Lack of evidence as far as progesterone/testosterone and other supplements benefit in the absence of symptoms in a patient status post hysterectomy.  We also reviewed the issues of mastalgia which certainly can occur with estrogen replacement and options for management to include continuing medication until significant discomfort then stopping for a week or so and then restarting assuming she then obtains relief of her symptoms versus adding androgen product such as danazol/testosterone to see if this does not help accepting potential androgen side effects.  At this point the patient wants to go ahead and reinitiate estradiol and will start low with 0.0375 patch.  After a month or 2 she will see how she is feeling and if adequate relief of her hot flushes and sweats without significant mastalgia then will continue.  If inadequate relief then will titrate to 0.05 mg patch.  Patient will continue to follow-up with Dr. Maudie Mercury for routine health maintenance and Dr Cruzita Lederer for thyroid.  We did discuss current Pap smear recommendations and as she is status post hysterectomy for benign indications and now 67 recommendations to stop Pap smear surveillance were reviewed.  Would also consider baseline bone density now at age 67.  I spent a total of 30 face-to-face minutes with the patient, over 50% was spent counseling and coordination of care.   Olivia Auerbach MD, 3:47 PM 12/18/2017

## 2017-12-18 NOTE — Patient Instructions (Signed)
Start on the estrogen patch as we discussed.  Call if any issues.  I would recommend scheduling a baseline bone density.  You can do that through our office or through Dr. Kim's/Dr. Arman Filter office.

## 2017-12-22 ENCOUNTER — Ambulatory Visit: Payer: Medicare HMO | Admitting: Physical Therapy

## 2017-12-22 ENCOUNTER — Telehealth: Payer: Self-pay | Admitting: *Deleted

## 2017-12-22 NOTE — Telephone Encounter (Signed)
Prior authorization for estradiol (climara) 0.0375 mg patch done via cover my meds website, will for response.

## 2017-12-23 NOTE — Telephone Encounter (Signed)
Medication approved per [employer] until 06/15/2018

## 2017-12-24 ENCOUNTER — Ambulatory Visit: Payer: Medicare HMO | Attending: Family Medicine | Admitting: Physical Therapy

## 2017-12-24 DIAGNOSIS — M545 Low back pain: Secondary | ICD-10-CM | POA: Diagnosis not present

## 2017-12-24 DIAGNOSIS — M25552 Pain in left hip: Secondary | ICD-10-CM | POA: Diagnosis not present

## 2017-12-24 DIAGNOSIS — M6281 Muscle weakness (generalized): Secondary | ICD-10-CM

## 2017-12-24 DIAGNOSIS — G8929 Other chronic pain: Secondary | ICD-10-CM

## 2017-12-24 NOTE — Therapy (Signed)
[facility] [address], [place], [name], [address] Phone: [phone]   Fax:  [phone]  Physical Therapy Treatment  Patient Details  Name: Olivia Morrison MRN: [id number] Date of Birth: 11/08/1950 Referring Provider: Lucretia Kern, DO   Encounter Date: 12/24/2017  PT End of Session - 12/24/17 1115    Visit Number  7    Date for PT Re-Evaluation  01/11/18    Authorization Type  medicare    PT Start Time  1103    PT Stop Time  1143    PT Time Calculation (min)  40 min    Activity Tolerance  Patient tolerated treatment well    Behavior During Therapy  [facility] for tasks assessed/performed       Past Medical History:  Diagnosis Date  . Abnormal stress test    2016, saw cards, nuclear scan planned  . Depression    seeing [name] - psychologist  . GERD (gastroesophageal reflux disease)   . Melanoma ([place])    in 2016, removed; seeing Dr. [name]    Past Surgical History:  Procedure Laterality Date  . ABDOMINAL HYSTERECTOMY  1993   leiomyomata, bleeding  . BREAST BIOPSY Right 2015  . Eye lid surg    . Melanoma excised    . TONSILLECTOMY  1959    There were no vitals filed for this visit.  Subjective Assessment - 12/24/17 1117    Subjective  I haven't done anything for the last 2 weeks because of the eye surgery.  Denies pain today, but had a lot of pain for a couple of days after the previous session.      Patient Stated Goals  reduce or get rid of pain, be able to play with grandchildren on the floor    Currently in Pain?  No/denies                       [facility] Adult PT Treatment/Exercise - 12/24/17 0001      Lumbar Exercises: Machines for Strengthening   Leg Press  single leg 30 lb      Lumbar Exercises: Standing   Wall Slides  5 seconds;Limitations;20 reps    Wall Slides Limitations  red band around knees    Other Standing Lumbar Exercises  hip series - abd and ext - yellow band - 20x  each      Lumbar Exercises: Seated   Other Seated Lumbar Exercises  sitting on ball  W's       Manual Therapy   Muscle Energy Technique  to correct left anterior rotation - isometric extension 20 sec x 3; then abductoin 10x               PT Short Term Goals - 11/24/17 1014      PT SHORT TERM GOAL #1   Title  ind with initial HEP    Time  4    Period  Weeks    Status  Achieved      PT SHORT TERM GOAL #2   Title  able to sit cross-leg on the floor with grandchildren for caretaking activities.    Baseline  slight pain but able to do    Time  4    Period  Weeks    Status  Achieved        PT Long Term Goals - 12/24/17 1153      PT LONG TERM GOAL #1   Title  pt will demonstrate 5/5 MMT bilateral hip strength for improved stabilty during functional lifting    Baseline  left hip adduction and abduction 4/5    Time  8    Period  Weeks    Status  On-going      PT LONG TERM GOAL #2   Title  pt will report 60% reduction of pain during typical week    Time  8    Period  Weeks    Status  On-going      PT LONG TERM GOAL #3   Title  Pt will be able to sit on floor with grandchildren for at least 30 minuts of playing without increased hip or back pain    Time  8    Period  Weeks    Status  On-going      PT LONG TERM GOAL #5   Title  ind with advanced HEP    Time  8    Period  Weeks    Status  On-going            Plan - 12/24/17 1146    Clinical Impression Statement  Pt reports feeling improvements overall, but was tough to say how much today due to eye surgery making her unable to do anything for a couple of weeks.  Pt responded well to exercises and can feel mucsle fatigue in her hips.  Pt demonstrates good technique needing some cues initially to keep hip and LE in alignment.  Pt had reduction of left anterior rotation with MET.  Pt will benefit from skilled therapy to progress strength.    PT Treatment/Interventions  ADLs/Self Care Home  Management;Biofeedback;Cryotherapy;Electrical Stimulation;Iontophoresis 41m/ml Dexamethasone;Moist Heat;Traction;Therapeutic activities;Therapeutic exercise;Neuromuscular re-education;Patient/family education;Manual techniques;Passive range of motion;Dry needling;Taping    PT Next Visit Plan  f/u on pelvic alignment, step ups, standing core and hip, squats, hip extension with band, hip series with band, open book and hip flexor stretch, prone and sitting on ball    PT Home Exercise Plan  Access Code: [email or web address]   Consulted and Agree with Plan of Care  Patient       Patient will benefit from skilled therapeutic intervention in order to improve the following deficits and impairments:  Increased muscle spasms, Increased fascial restricitons, Impaired flexibility, Decreased strength, Pain, Postural dysfunction, Decreased range of motion  Visit Diagnosis: Chronic bilateral low back pain, with sciatica presence unspecified  Pain in left hip  Muscle weakness (generalized)     Problem List Patient Active Problem List   Diagnosis Date Noted  . Hypothyroidism 07/22/2017  . Menopausal hot flushes 07/22/2017  . Snoring 08/07/2015  . Rhinitis, chronic 08/07/2015  . Bereavement 05/07/2015  . [place] 05[place]  . Abnormal ECG during exercise stress test 05/01/2015  . Hyperlipidemia 05/01/2015    JWayne Both7/04/2018, 11:54 AM  [facility] [address] [place] [address]Phone: [phone]  Fax:  [phone] Name: Olivia FahsMRN: [id number] of Birth: 4November 23, 1952

## 2017-12-29 ENCOUNTER — Encounter: Payer: Medicare HMO | Admitting: Physical Therapy

## 2017-12-31 ENCOUNTER — Ambulatory Visit: Payer: Medicare HMO | Admitting: Physical Therapy

## 2017-12-31 DIAGNOSIS — M6281 Muscle weakness (generalized): Secondary | ICD-10-CM | POA: Diagnosis not present

## 2017-12-31 DIAGNOSIS — D2261 Melanocytic nevi of right upper limb, including shoulder: Secondary | ICD-10-CM | POA: Diagnosis not present

## 2017-12-31 DIAGNOSIS — D2262 Melanocytic nevi of left upper limb, including shoulder: Secondary | ICD-10-CM | POA: Diagnosis not present

## 2017-12-31 DIAGNOSIS — D2271 Melanocytic nevi of right lower limb, including hip: Secondary | ICD-10-CM | POA: Diagnosis not present

## 2017-12-31 DIAGNOSIS — L918 Other hypertrophic disorders of the skin: Secondary | ICD-10-CM | POA: Diagnosis not present

## 2017-12-31 DIAGNOSIS — D225 Melanocytic nevi of trunk: Secondary | ICD-10-CM | POA: Diagnosis not present

## 2017-12-31 DIAGNOSIS — M25552 Pain in left hip: Secondary | ICD-10-CM | POA: Diagnosis not present

## 2017-12-31 DIAGNOSIS — G8929 Other chronic pain: Secondary | ICD-10-CM

## 2017-12-31 DIAGNOSIS — D2272 Melanocytic nevi of left lower limb, including hip: Secondary | ICD-10-CM | POA: Diagnosis not present

## 2017-12-31 DIAGNOSIS — Z85820 Personal history of malignant melanoma of skin: Secondary | ICD-10-CM | POA: Diagnosis not present

## 2017-12-31 DIAGNOSIS — L814 Other melanin hyperpigmentation: Secondary | ICD-10-CM | POA: Diagnosis not present

## 2017-12-31 DIAGNOSIS — L820 Inflamed seborrheic keratosis: Secondary | ICD-10-CM | POA: Diagnosis not present

## 2017-12-31 DIAGNOSIS — M545 Low back pain: Secondary | ICD-10-CM | POA: Diagnosis not present

## 2017-12-31 DIAGNOSIS — L821 Other seborrheic keratosis: Secondary | ICD-10-CM | POA: Diagnosis not present

## 2017-12-31 NOTE — Therapy (Signed)
[facility] [address] [place], [name], [address] Phone: [phone]   Fax:  [phone]  Physical Therapy Treatment  Patient Details  Name: Olivia Morrison MRN: [id number] Date of Birth: Jun 24, 1950 Referring Provider: Lucretia Kern, DO   Encounter Date: 12/31/2017  PT End of Session - 12/31/17 1414    Visit Number  8    Date for PT Re-Evaluation  01/11/18    Authorization Type  medicare    PT Start Time  1414    PT Stop Time  1443    PT Time Calculation (min)  29 min    Activity Tolerance  Patient tolerated treatment well    Behavior During Therapy  [facility] for tasks assessed/performed       Past Medical History:  Diagnosis Date  . Abnormal stress test    2016, saw cards, nuclear scan planned  . Depression    seeing [name] - psychologist  . GERD (gastroesophageal reflux disease)   . Melanoma ([place])    in 2016, removed; seeing Dr. [name]    Past Surgical History:  Procedure Laterality Date  . ABDOMINAL HYSTERECTOMY  1993   leiomyomata, bleeding  . BREAST BIOPSY Right 2015  . Eye lid surg    . Melanoma excised    . TONSILLECTOMY  1959    There were no vitals filed for this visit.  Subjective Assessment - 12/31/17 1417    Subjective  I am doing a little better today.  Getting up and down from sitting is a little painful.    Currently in Pain?  No/denies                       [facility] Adult PT Treatment/Exercise - 12/31/17 0001      Lumbar Exercises: Aerobic   Nustep  L1 x 6 min      Lumbar Exercises: Standing   Functional Squats  10 reps    Other Standing Lumbar Exercises  hip series - abd and flex- yellow band - 15x each      Lumbar Exercises: Sidelying   Clam  Left;20 reps reverse clam      Knee/Hip Exercises: Standing   Lateral Step Up  20 reps BOSU    Forward Step Up  20 reps;Hand Hold: 0;Step Height: 6"    Step Down  Left;Hand Hold: 2;15 reps;Step Height: 2"    Walking  with Sports Cord  fwd/back - 20 lb - 5x each way               PT Short Term Goals - 11/24/17 1014      PT SHORT TERM GOAL #1   Title  ind with initial HEP    Time  4    Period  Weeks    Status  Achieved      PT SHORT TERM GOAL #2   Title  able to sit cross-leg on the floor with grandchildren for caretaking activities.    Baseline  slight pain but able to do    Time  4    Period  Weeks    Status  Achieved        PT Long Term Goals - 12/24/17 1153      PT LONG TERM GOAL #1   Title  pt will demonstrate 5/5 MMT bilateral hip strength for improved stabilty during functional lifting    Baseline  left hip adduction and abduction 4/5    Time  8    Period  Weeks    Status  On-going      PT LONG TERM GOAL #2   Title  pt will report 60% reduction of pain during typical week    Time  8    Period  Weeks    Status  On-going      PT LONG TERM GOAL #3   Title  Pt will be able to sit on floor with grandchildren for at least 30 minuts of playing without increased hip or back pain    Time  8    Period  Weeks    Status  On-going      PT LONG TERM GOAL #5   Title  ind with advanced HEP    Time  8    Period  Weeks    Status  On-going            Plan - 12/31/17 1443    Clinical Impression Statement  Pt has been feeling okay.  She reports pain still comes and goes, but no increased pain with exercises.  Pt did well with additions to LE and core strength today including some single leg.  Pt demonstrates improved ability to maintiain neutral pelvis during all exericses only needing minimal cues.  Pt will continue with POC.    PT Treatment/Interventions  ADLs/Self Care Home Management;Biofeedback;Cryotherapy;Electrical Stimulation;Iontophoresis 4mg /ml Dexamethasone;Moist Heat;Traction;Therapeutic activities;Therapeutic exercise;Neuromuscular re-education;Patient/family education;Manual techniques;Passive range of motion;Dry needling;Taping    PT Next Visit Plan  f/u on pelvic  alignment, hip strengthening in all directions, single leg, squats add weight    Consulted and Agree with Plan of Care  Patient       Patient will benefit from skilled therapeutic intervention in order to improve the following deficits and impairments:  Increased muscle spasms, Increased fascial restricitons, Impaired flexibility, Decreased strength, Pain, Postural dysfunction, Decreased range of motion  Visit Diagnosis: Chronic bilateral low back pain, with sciatica presence unspecified  Pain in left hip  Muscle weakness (generalized)     Problem List Patient Active Problem List   Diagnosis Date Noted  . Hypothyroidism 07/22/2017  . Menopausal hot flushes 07/22/2017  . Snoring 08/07/2015  . Rhinitis, chronic 08/07/2015  . Bereavement 05/07/2015  . Melanoma of skin ([place]) 05/07/2015  . Abnormal ECG during exercise stress test 05/01/2015  . Hyperlipidemia 05/01/2015    Zannie Cove, PT 12/31/2017, 2:54 PM  [facility] [address] [place], [name], [address] Phone: [phone]   Fax:  [phone]  Name: Olivia Morrison MRN: [id number] Date of Birth: 1950-08-10

## 2018-01-05 ENCOUNTER — Ambulatory Visit: Payer: Medicare HMO | Admitting: Physical Therapy

## 2018-01-05 ENCOUNTER — Encounter: Payer: Self-pay | Admitting: Physical Therapy

## 2018-01-05 DIAGNOSIS — M25552 Pain in left hip: Secondary | ICD-10-CM | POA: Diagnosis not present

## 2018-01-05 DIAGNOSIS — G8929 Other chronic pain: Secondary | ICD-10-CM

## 2018-01-05 DIAGNOSIS — M6281 Muscle weakness (generalized): Secondary | ICD-10-CM

## 2018-01-05 DIAGNOSIS — M545 Low back pain: Principal | ICD-10-CM

## 2018-01-05 NOTE — Therapy (Signed)
[facility] [address] [place], [name], [address] Phone: [phone]   Fax:  [phone]  Physical Therapy Treatment  Patient Details  Name: Olivia Morrison MRN: [id number] Date of Birth: 07-30-1950 Referring Provider: Lucretia Kern, DO   Encounter Date: 01/05/2018  PT End of Session - 01/05/18 1107    Visit Number  9    Date for PT Re-Evaluation  01/11/18    Authorization Type  medicare    PT Start Time  6962    PT Stop Time  1445    PT Time Calculation (min)  40 min    Activity Tolerance  Patient tolerated treatment well    Behavior During Therapy  [facility] for tasks assessed/performed       Past Medical History:  Diagnosis Date  . Abnormal stress test    2016, saw cards, nuclear scan planned  . Depression    seeing [name] - psychologist  . GERD (gastroesophageal reflux disease)   . Melanoma ([place])    in 2016, removed; seeing Dr. [name]    Past Surgical History:  Procedure Laterality Date  . ABDOMINAL HYSTERECTOMY  1993   leiomyomata, bleeding  . BREAST BIOPSY Right 2015  . Eye lid surg    . Melanoma excised    . TONSILLECTOMY  1959    There were no vitals filed for this visit.  Subjective Assessment - 01/05/18 1113    Subjective  I had the grandbabies on Friday and carried them around without pain afterwards    Limitations  Sitting;House hold activities;Other (comment);Lifting    Patient Stated Goals  reduce or get rid of pain, be able to play with grandchildren on the floor    Currently in Pain?  No/denies                       [facility] Adult PT Treatment/Exercise - 01/05/18 0001      Lumbar Exercises: Stretches   ITB Stretch  Right;Left;30 seconds;2 reps    Piriformis Stretch  Right;Left;3 reps;20 seconds    Other Lumbar Stretch Exercise  internal rotation hip stretch - 2 x 20 sec      Lumbar Exercises: Aerobic   Nustep  L1 x 4 min; L2 x 3 min      Lumbar Exercises: Standing    Functional Squats  20 reps into almost sitting position    Wall Slides Limitations  red band around knees - side step 3 laps      Lumbar Exercises: Sidelying   Clam  Left;20 reps reverse clam      Lumbar Exercises: Prone   Straight Leg Raise  10 reps;Limitations    Straight Leg Raises Limitations  1.5 lb ankle weight    Opposite Arm/Leg Raise  Right arm/Left leg;Left arm/Right leg;5 reps      Knee/Hip Exercises: Standing   SLS with Vectors  3 ways - heel slides - 10 each way    Walking with Sports Cord  fwd/back - 20 lb - 5x each way               PT Short Term Goals - 11/24/17 1014      PT SHORT TERM GOAL #1   Title  ind with initial HEP    Time  4    Period  Weeks    Status  Achieved      PT SHORT TERM GOAL #2   Title  able to sit  cross-leg on the floor with grandchildren for caretaking activities.    Baseline  slight pain but able to do    Time  4    Period  Weeks    Status  Achieved        PT Long Term Goals - 12/24/17 1153      PT LONG TERM GOAL #1   Title  pt will demonstrate 5/5 MMT bilateral hip strength for improved stabilty during functional lifting    Baseline  left hip adduction and abduction 4/5    Time  8    Period  Weeks    Status  On-going      PT LONG TERM GOAL #2   Title  pt will report 60% reduction of pain during typical week    Time  8    Period  Weeks    Status  On-going      PT LONG TERM GOAL #3   Title  Pt will be able to sit on floor with grandchildren for at least 30 minuts of playing without increased hip or back pain    Time  8    Period  Weeks    Status  On-going      PT LONG TERM GOAL #5   Title  ind with advanced HEP    Time  8    Period  Weeks    Status  On-going            Plan - 01/05/18 1232    Clinical Impression Statement  Pt continues to feel good with exercises.  She demonstrates fatigue with side step and doing exercises that engage glute med.  Pt will continue to benefit from skilled PT to ensure  successful transition to HEP.    PT Treatment/Interventions  ADLs/Self Care Home Management;Biofeedback;Cryotherapy;Electrical Stimulation;Iontophoresis 4mg /ml Dexamethasone;Moist Heat;Traction;Therapeutic activities;Therapeutic exercise;Neuromuscular re-education;Patient/family education;Manual techniques;Passive range of motion;Dry needling;Taping    PT Next Visit Plan  possible discharge re-assess pelvic alignment, hip strengthening in all directions, single leg, squats add weight    PT Home Exercise Plan  Access Code: [email or web address]    Consulted and Agree with Plan of Care  Patient       Patient will benefit from skilled therapeutic intervention in order to improve the following deficits and impairments:  Increased muscle spasms, Increased fascial restricitons, Impaired flexibility, Decreased strength, Pain, Postural dysfunction, Decreased range of motion  Visit Diagnosis: Chronic bilateral low back pain, with sciatica presence unspecified  Pain in left hip  Muscle weakness (generalized)     Problem List Patient Active Problem List   Diagnosis Date Noted  . Hypothyroidism 07/22/2017  . Menopausal hot flushes 07/22/2017  . Snoring 08/07/2015  . Rhinitis, chronic 08/07/2015  . Bereavement 05/07/2015  . Melanoma of skin ([place]) 05/07/2015  . Abnormal ECG during exercise stress test 05/01/2015  . Hyperlipidemia 05/01/2015    Zannie Cove, PT 01/05/2018, 12:34 PM  [facility] [address], [place], [name], [address] Phone: [phone]   Fax:  [phone]  Name: Olivia Morrison MRN: [id number] Date of Birth: 1950/10/26

## 2018-01-07 ENCOUNTER — Ambulatory Visit: Payer: Medicare HMO | Admitting: Physical Therapy

## 2018-01-07 ENCOUNTER — Encounter: Payer: Self-pay | Admitting: Physical Therapy

## 2018-01-07 DIAGNOSIS — G8929 Other chronic pain: Secondary | ICD-10-CM

## 2018-01-07 DIAGNOSIS — M25552 Pain in left hip: Secondary | ICD-10-CM

## 2018-01-07 DIAGNOSIS — M6281 Muscle weakness (generalized): Secondary | ICD-10-CM

## 2018-01-07 DIAGNOSIS — M545 Low back pain: Secondary | ICD-10-CM | POA: Diagnosis not present

## 2018-01-07 NOTE — Patient Instructions (Signed)
Access Code: [email or web address]  URL: [email or web address]  Date: 01/07/2018  Prepared by: Lovett Calender   Exercises  Supine Hamstring Stretch with Strap - 10 reps - 3 sets - 1x daily - 7x weekly  Supine Piriformis Stretch - 3 reps - 1 sets - 30 sec hold - 1x daily - 7x weekly  Supine Bilateral Hip Internal Rotation Stretch - 3 reps - 3 sets - 1x daily - 7x weekly  Sidelying Hip Abduction - 10 reps - 1 sets - 1x daily - 7x weekly  Sidelying Hip Adduction with Ankle Weight - Leg Behind - 10 reps - 1 sets - 1x daily - 7x weekly  Reverse Clamshell in Extension and Abduction - 10 reps - 1 sets - 1x daily - 7x weekly  Supine Bridge with Resistance Band - 10 reps - 2 sets - 1x daily - 7x weekly  Sidelying Reverse Clamshell - 10 reps - 3 sets - 1x daily - 7x weekly  Prone Alternating Arm and Leg Lifts - 10 reps - 1 sets - 1x daily - 7x weekly  Standing Repeated Hip Flexion with Resistance - 10 reps - 1 sets - 1x daily - 7x weekly  Standing Repeated Hip Adduction with Resistance - 10 reps - 3 sets - 1x daily - 7x weekly  Standing Repeated Hip Abduction with Resistance - 10 reps - 1 sets - 1x daily - 7x weekly  Wall Squat with Swiss Ball and Resistance Loop - 10 reps - 2 sets - 5 sec hold - 1x daily - 7x weekly   [facility] [address] [place], [place] [address] Phone # [phone] Fax [phone]

## 2018-01-07 NOTE — Therapy (Signed)
[facility] [address], [place], [name], [address] Phone: [phone]   Fax:  [phone]  Physical Therapy Treatment Progress Note Reporting Period 11/16/17 to 01/07/18   See note below for Objective Data and Assessment of Progress/Goals.      Patient Details  Name: Adaisha Campise MRN: [id number] Date of Birth: June 21, 1950 Referring Provider: Lucretia Kern, DO   Encounter Date: 01/07/2018  PT End of Session - 01/07/18 1101    Visit Number  10    Date for PT Re-Evaluation  01/11/18    Authorization Type  medicare    PT Start Time  1101    PT Stop Time  1143    PT Time Calculation (min)  42 min    Activity Tolerance  Patient tolerated treatment well    Behavior During Therapy  [facility] for tasks assessed/performed       Past Medical History:  Diagnosis Date  . Abnormal stress test    2016, saw cards, nuclear scan planned  . Depression    seeing [name] - psychologist  . GERD (gastroesophageal reflux disease)   . Melanoma ([place])    in 2016, removed; seeing Dr. [name]    Past Surgical History:  Procedure Laterality Date  . ABDOMINAL HYSTERECTOMY  1993   leiomyomata, bleeding  . BREAST BIOPSY Right 2015  . Eye lid surg    . Melanoma excised    . TONSILLECTOMY  1959    There were no vitals filed for this visit.  Subjective Assessment - 01/07/18 1150    Subjective  I did a lot of cleaning yesterday and feeling it in the hip a little but not much.  Denies pain currently    Currently in Pain?  No/denies         [facility] PT Assessment - 01/07/18 0001      Strength   Right Hip Flexion  5/5    Right Hip Extension  5/5    Right Hip External Rotation   5/5    Right Hip Internal Rotation  5/5    Right Hip ABduction  5/5    Right Hip ADduction  4+/5    Left Hip Flexion  5/5    Left Hip Extension  5/5    Left Hip External Rotation  5/5    Left Hip Internal Rotation  4+/5    Left Hip ABduction  5/5     Left Hip ADduction  4+/5                   [facility] Adult PT Treatment/Exercise - 01/07/18 0001      Lumbar Exercises: Standing   Other Standing Lumbar Exercises  hip series - abd and flex, adduction- yellow band - 15x each      Lumbar Exercises: Sidelying   Clam  Left;20 reps;Right reverse clam    Hip Abduction  -- 2x 10 one set with internal rotation    Other Sidelying Lumbar Exercises  hip adduction 2 x 10      Manual Therapy   Soft tissue mobilization  rolling IT band left side             PT Education - 01/07/18 1148    Education Details   Access Code: [email or web address]     Person(s) Educated  Patient    Methods  Explanation;Demonstration;Handout;Verbal cues    Comprehension  Verbalized understanding;Returned demonstration       PT Short Term Goals - 11/24/17  [place] #1   Title  ind with initial HEP    Time  4    Period  Weeks    Status  Achieved      PT SHORT TERM GOAL #2   Title  able to sit cross-leg on the floor with grandchildren for caretaking activities.    Baseline  slight pain but able to do    Time  4    Period  Weeks    Status  Achieved        PT Long Term Goals - 01/07/18 1151      PT LONG TERM GOAL #1   Title  pt will demonstrate 5/5 MMT bilateral hip strength for improved stabilty during functional lifting    Baseline  hip abduction 4+/5, everything else    Status  Partially Met      PT LONG TERM GOAL #2   Title  pt will report 60% reduction of pain during typical week    Baseline  not sure but there is less frequent pain and the stretches and exercises help to manage the pain    Status  Partially Met      PT LONG TERM GOAL #3   Title  Pt will be able to sit on floor with grandchildren for at least 30 minuts of playing without increased hip or back pain    Status  Achieved      PT LONG TERM GOAL #4   Title  FOTO < or = to 39% limited    Baseline  31%    Status  Achieved      PT LONG TERM GOAL #5   Title   ind with advanced HEP    Status  Achieved            Plan - 01/07/18 1204    Clinical Impression Statement  Patient has met most goals . She is able to manage her pain with HEP and she will do well at this time to discharge with HEP    PT Treatment/Interventions  ADLs/Self Care Home Management;Biofeedback;Cryotherapy;Electrical Stimulation;Iontophoresis 44m/ml Dexamethasone;Moist Heat;Traction;Therapeutic activities;Therapeutic exercise;Neuromuscular re-education;Patient/family education;Manual techniques;Passive range of motion;Dry needling;Taping    PT Next Visit Plan  discharged today    PT Home Exercise Plan  Access Code: [email or web address]   Consulted and Agree with Plan of Care  Patient       Patient will benefit from skilled therapeutic intervention in order to improve the following deficits and impairments:  Increased muscle spasms, Increased fascial restricitons, Impaired flexibility, Decreased strength, Pain, Postural dysfunction, Decreased range of motion  Visit Diagnosis: Chronic bilateral low back pain, with sciatica presence unspecified  Pain in left hip  Muscle weakness (generalized)     Problem List Patient Active Problem List   Diagnosis Date Noted  . Hypothyroidism 07/22/2017  . Menopausal hot flushes 07/22/2017  . Snoring 08/07/2015  . Rhinitis, chronic 08/07/2015  . Bereavement 05/07/2015  . [place] 05/07[place]  . Abnormal ECG during exercise stress test 05/01/2015  . Hyperlipidemia 05/01/2015    JZannie Cove PT 01/07/2018, 12:31 PM  [facility] [address] [place] [address]Phone: [phone]  Fax:  [phone] Name: DJuanice WarburtonMRN: [id number] of Birth: 4October 18, 1952 PHYSICAL THERAPY DISCHARGE SUMMARY  Visits from Start of Care: 10  Current functional level related to goals / functional outcomes: See above   Remaining deficits:  See above    Education / Equipment: HEP  Plan: Patient agrees to discharge.  Patient goals were partially met. Patient is being discharged due to being pleased with the current functional level.  ?????    Google, PT 01/07/18 12:31 PM

## 2018-01-12 DIAGNOSIS — S2231XA Fracture of one rib, right side, initial encounter for closed fracture: Secondary | ICD-10-CM | POA: Diagnosis not present

## 2018-01-13 ENCOUNTER — Encounter: Payer: Self-pay | Admitting: Family Medicine

## 2018-01-13 ENCOUNTER — Telehealth: Payer: Self-pay | Admitting: Family Medicine

## 2018-01-13 DIAGNOSIS — S2239XA Fracture of one rib, unspecified side, initial encounter for closed fracture: Secondary | ICD-10-CM

## 2018-01-13 NOTE — Telephone Encounter (Signed)
Copied from [place] [id number]. Topic: Quick Communication - See Telephone Encounter >> Jan 13, 2018  8:25 AM Mylinda Latina, NT wrote: CRM for notification. See Telephone encounter for: 01/13/18. Patient called and states that she had an accident( she fell) and was seen at the Urgent Care. She had fractured her rib. She is wanting Dr. Maudie Mercury recommendation who to see next for this injury . Patient is requesting a call back. CB# [phone]

## 2018-01-14 DIAGNOSIS — S2231XA Fracture of one rib, right side, initial encounter for closed fracture: Secondary | ICD-10-CM | POA: Diagnosis not present

## 2018-01-14 NOTE — Telephone Encounter (Signed)
Copied from [place] [id number]. Topic: Quick Communication - See Telephone Encounter >> Jan 14, 2018  8:35 AM Yvette Rack wrote: Pt calling assistant back please call at [phone]

## 2018-01-14 NOTE — Telephone Encounter (Signed)
See prior message

## 2018-01-14 NOTE — Telephone Encounter (Signed)
I called the pt and informed her per Tommi Rumps he misunderstood the initial message.  He advised a referral can be placed to an orthopedic doctor and he is not sure what they would recommend.  She is aware the referral was placed and someone will call with appt info.

## 2018-01-14 NOTE — Telephone Encounter (Signed)
I left a message for the pt to return my call. 

## 2018-01-14 NOTE — Telephone Encounter (Signed)
Copied from [place] [id number]. Topic: Quick Communication - See Telephone Encounter >> Jan 14, 2018  8:35 AM Yvette Rack wrote: Pt calling assistant back please call at [phone]

## 2018-01-17 ENCOUNTER — Encounter: Payer: Self-pay | Admitting: Internal Medicine

## 2018-01-25 ENCOUNTER — Encounter: Payer: Self-pay | Admitting: Internal Medicine

## 2018-02-01 ENCOUNTER — Other Ambulatory Visit: Payer: Self-pay | Admitting: Internal Medicine

## 2018-02-01 MED ORDER — NATURE-THROID 65 MG PO TABS: 65.0000 mg | ORAL_TABLET | Freq: Every day | ORAL | 3 refills | Status: DC

## 2018-03-23 ENCOUNTER — Ambulatory Visit: Payer: Self-pay | Admitting: Psychiatry

## 2018-03-30 DIAGNOSIS — M5126 Other intervertebral disc displacement, lumbar region: Secondary | ICD-10-CM | POA: Diagnosis not present

## 2018-03-30 DIAGNOSIS — M6240 Contracture of muscle, unspecified site: Secondary | ICD-10-CM | POA: Diagnosis not present

## 2018-03-30 DIAGNOSIS — M5136 Other intervertebral disc degeneration, lumbar region: Secondary | ICD-10-CM | POA: Diagnosis not present

## 2018-03-30 DIAGNOSIS — M9903 Segmental and somatic dysfunction of lumbar region: Secondary | ICD-10-CM | POA: Diagnosis not present

## 2018-03-30 DIAGNOSIS — M545 Low back pain: Secondary | ICD-10-CM | POA: Diagnosis not present

## 2018-03-30 DIAGNOSIS — M791 Myalgia, unspecified site: Secondary | ICD-10-CM | POA: Diagnosis not present

## 2018-03-30 DIAGNOSIS — M25552 Pain in left hip: Secondary | ICD-10-CM | POA: Diagnosis not present

## 2018-03-30 DIAGNOSIS — R293 Abnormal posture: Secondary | ICD-10-CM | POA: Diagnosis not present

## 2018-04-06 DIAGNOSIS — M6240 Contracture of muscle, unspecified site: Secondary | ICD-10-CM | POA: Diagnosis not present

## 2018-04-06 DIAGNOSIS — M791 Myalgia, unspecified site: Secondary | ICD-10-CM | POA: Diagnosis not present

## 2018-04-06 DIAGNOSIS — R293 Abnormal posture: Secondary | ICD-10-CM | POA: Diagnosis not present

## 2018-04-06 DIAGNOSIS — M25552 Pain in left hip: Secondary | ICD-10-CM | POA: Diagnosis not present

## 2018-04-06 DIAGNOSIS — M9903 Segmental and somatic dysfunction of lumbar region: Secondary | ICD-10-CM | POA: Diagnosis not present

## 2018-04-06 DIAGNOSIS — M5136 Other intervertebral disc degeneration, lumbar region: Secondary | ICD-10-CM | POA: Diagnosis not present

## 2018-04-06 DIAGNOSIS — M5126 Other intervertebral disc displacement, lumbar region: Secondary | ICD-10-CM | POA: Diagnosis not present

## 2018-04-06 DIAGNOSIS — M545 Low back pain: Secondary | ICD-10-CM | POA: Diagnosis not present

## 2018-04-13 DIAGNOSIS — M5136 Other intervertebral disc degeneration, lumbar region: Secondary | ICD-10-CM | POA: Diagnosis not present

## 2018-04-13 DIAGNOSIS — M9903 Segmental and somatic dysfunction of lumbar region: Secondary | ICD-10-CM | POA: Diagnosis not present

## 2018-04-13 DIAGNOSIS — M545 Low back pain: Secondary | ICD-10-CM | POA: Diagnosis not present

## 2018-04-13 DIAGNOSIS — M791 Myalgia, unspecified site: Secondary | ICD-10-CM | POA: Diagnosis not present

## 2018-04-13 DIAGNOSIS — M5126 Other intervertebral disc displacement, lumbar region: Secondary | ICD-10-CM | POA: Diagnosis not present

## 2018-04-13 DIAGNOSIS — M25552 Pain in left hip: Secondary | ICD-10-CM | POA: Diagnosis not present

## 2018-04-13 DIAGNOSIS — R293 Abnormal posture: Secondary | ICD-10-CM | POA: Diagnosis not present

## 2018-04-13 DIAGNOSIS — M6240 Contracture of muscle, unspecified site: Secondary | ICD-10-CM | POA: Diagnosis not present

## 2018-04-19 ENCOUNTER — Ambulatory Visit (INDEPENDENT_AMBULATORY_CARE_PROVIDER_SITE_OTHER): Payer: Medicare HMO | Admitting: Psychiatry

## 2018-04-19 DIAGNOSIS — F411 Generalized anxiety disorder: Secondary | ICD-10-CM | POA: Diagnosis not present

## 2018-04-19 DIAGNOSIS — R69 Illness, unspecified: Secondary | ICD-10-CM | POA: Diagnosis not present

## 2018-04-19 NOTE — Progress Notes (Signed)
      [facility] Counselor/Therapist Progress Note   Patient ID: Olivia Morrison, MRN: [id number]  Date: 04/19/2018  Timespent: 57 minutes   Treatment Type: Individual   Reported Symptoms: Fatigue   Mental Status Exam:    Appearance:   Well Groomed     Behavior:  Appropriate  Motor:  Normal  Speech/Language:   Clear and Coherent  Affect:  Appropriate  Mood:  anxious  Thought process:  normal  Thought content:    WNL  Sensory/Perceptual disturbances:    WNL  Orientation:  oriented to person, place, time/date and situation  Attention:  Good  Concentration:  Good  Memory:  WNL  Fund of knowledge:   Good  Insight:    Good  Judgment:   Good  Impulse Control:  Good     Risk Assessment: Danger to Self:  No Self-injurious Behavior: No Danger to Others: No Duty to Warn:no Physical Aggression / Violence:No  Access to Firearms a concern: No  Gang Involvement:No    Subjective: The client states that she recently went on a women's retreat.  It was not as helpful as she had hoped.  She did realize that "I am so hungry for fellowship."  She did make some positive contacts with other women.  This was part of increasing her social network as we talked about.  I encouraged the client to be more proactive and follow-up on the women she had talked to and possibly meet for dinner.  She will try to do this between now and the next time I see her. The client discussed her ongoing insecurity.  We talked about using mood independent behavior.  The client states that when she tries that people always rise up to put her back in her place.  Then we discussed that it would be a time to practice her assertiveness skills.  The client is unsure if she can do this.  But she agrees to try.  She did concede that she will try to go to church each Sunday between now and the next time I see her.  This is important for her heart.  When she feels more connected to God her anxiety is  less.   Interventions: Assertiveness/Communication, Solution-Oriented/Positive Psychology and Insight-Oriented   Diagnosis:   ICD-10-CM   1. Generalized anxiety disorder F41.1      Plan: [place], meet with friends, assertiveness.   Albertina Parr Arjun Hard, [place]

## 2018-04-20 ENCOUNTER — Ambulatory Visit (INDEPENDENT_AMBULATORY_CARE_PROVIDER_SITE_OTHER): Payer: Medicare HMO | Admitting: Internal Medicine

## 2018-04-20 ENCOUNTER — Encounter: Payer: Self-pay | Admitting: Internal Medicine

## 2018-04-20 VITALS — BP 136/70 | HR 74 | Ht 63.5 in | Wt 141.0 lb

## 2018-04-20 DIAGNOSIS — E039 Hypothyroidism, unspecified: Secondary | ICD-10-CM | POA: Diagnosis not present

## 2018-04-20 LAB — T3, FREE: T3, Free: 4 pg/mL (ref 2.3–4.2)

## 2018-04-20 LAB — T4, FREE: Free T4: 0.66 ng/dL (ref 0.60–1.60)

## 2018-04-20 LAB — TSH: TSH: 1.4 u[IU]/mL (ref 0.35–4.50)

## 2018-04-20 NOTE — Progress Notes (Signed)
Patient ID: Olivia Morrison, female   DOB: 10/08/1950, 67 y.o.   MRN: [id number]    HPI  Olivia Morrison is a 67 y.o.-year-old female, returning for follow-up for hypothyroidism.  Last visit 6 months ago.  She started HRT >> feels a little better, but still hot flushes.  Reviewed and addended history: Patient has been seen by an integrative medicine doctor for the last 15 years. She has been on thyroid supplementation since 2016-2017, then on Nature-Throid 65 mg daily (1 gr).  It is not clear how the diagnosis of hypothyroidism was made but she does remember that this was mild.  At last visit, she was having hyperthyroid symptoms so we changed to levothyroxine 100 mcg daily.  As a TSH was slightly low afterwards, we decreased the dose of levothyroxine in 08/2017.  At last visit, she was on levothyroxine 88 mcg daily, but we switched back to Nature-Throid in 01/2018 (as she was gaining weight on levothyroxine).  Pt is on Nature-Throid 65 mg daily, taken: - in am - fasting - at least 30 min from b'fast - no Ca, Fe, PPIs - + MVI at the end of the day - not on Biotin  Reviewed patient's TFTs: Lab Results  Component Value Date   TSH 0.65 10/20/2017   TSH 0.18 (L) 08/31/2017   TSH 2.41 07/22/2017   TSH 0.63 05/22/2015   FREET4 0.95 10/20/2017   FREET4 1.14 08/31/2017   FREET4 0.69 07/22/2017   FREET4 0.64 05/22/2015  2018: TSH reportedly normal   She continues to experience hot flashes and fatigue.  She tried Relizen but only helped a little.  Pt denies: - feeling nodules in neck - hoarseness - dysphagia - choking - SOB with lying down  She has no FH of thyroid disorders. No FH of thyroid cancer. No h/o radiation tx to head or neck.  No herbal supplements. No Biotin use. No recent steroids use.   She has a h/o TAH in her 65s.  She has been on HRT since then.  In the past, she was also on testosterone cream, estrogen, and micronized progesterone. She tried to come off >> but more  anxiety and hot flushes >> Effexor was started >> helped with hot flushes but has lucid dreams with her deceased parents >> now off. I suggested to stop testosterone and progesterone, and to taper estrogen to off >> now on HRT.  ROS: Constitutional: no weight gain/no weight loss, no fatigue, no subjective hyperthermia, no subjective hypothermia Eyes: no blurry vision, no xerophthalmia ENT: no sore throat, + see HPI Cardiovascular: no CP/no SOB/no palpitations/no leg swelling Respiratory: no cough/no SOB/no wheezing Gastrointestinal: no N/no V/no D/no C/no acid reflux Musculoskeletal: no muscle aches/no joint aches Skin: no rashes, no hair loss Neurological: no tremors/no numbness/no tingling/no dizziness  I reviewed pt's medications, allergies, PMH, social hx, family hx, and changes were documented in the history of present illness. Otherwise, unchanged from my initial visit note.  Past Medical History:  Diagnosis Date  . Abnormal stress test    2016, saw cards, nuclear scan planned  . Depression    seeing [name] - psychologist  . GERD (gastroesophageal reflux disease)   . Melanoma ([place])    in 2016, removed; seeing Dr. [name]   Past Surgical History:  Procedure Laterality Date  . ABDOMINAL HYSTERECTOMY  1993   leiomyomata, bleeding  . BREAST BIOPSY Right 2015  . Eye lid surg    . Melanoma excised    . TONSILLECTOMY  56   Social History   Socioeconomic History  . Marital status: Married    Spouse name: Not on file  . Number of children: 2  Social Needs  Occupational History  .  Retired  Tobacco Use  . Smoking status: Former Smoker    Packs/day: 0.75    Years: 5.00    Pack years: 3.75    Types: Cigarettes    Last attempt to quit: 04/16/2007    Years since quitting: 10.2  . Smokeless tobacco: Never Used  Substance and Sexual Activity  . Alcohol use: Yes    Alcohol/week: 0.0 oz    Comment: 1-2 drinks over the weekend, previously 1 drink nightly (martini)  . Drug  use: No  . Sexual activity: Not on file  Other Topics Concern  . Not on file  Social History Narrative   Lives with husband in a 2 story home.  Has 2 sons.     Not currently working.  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    Education: some college.   Current Outpatient Medications on File Prior to Visit  Medication Sig Dispense Refill  . Acetylcysteine (NAC) 600 MG CAPS Take by mouth.    . Cholecalciferol (VITAMIN D3) 5000 units CAPS Take by mouth.    . Coenzyme Q10 (CO Q 10 PO) Take by mouth.    . estradiol (CLIMARA - DOSED IN MG/24 HR) 0.0375 mg/24hr patch Place 1 patch (0.0375 mg total) onto the skin once a week. 4 patch 12  . MAGNESIUM CITRATE PO Take 150 mg by mouth.    . Menaquinone-7 (VITAMIN K2 PO) Take by mouth.    . Multiple Vitamins-Minerals (ZINC PO) Take by mouth.    Marland Kitchen NATURE-THROID 65 MG tablet Take 1 tablet (65 mg total) by mouth daily. 60 tablet 3  . NON FORMULARY Relizen herbal supplement for hot flashes    . Nutritional Supplements (JUICE PLUS FIBRE PO) Take by mouth.    Marland Kitchen OVER THE COUNTER MEDICATION Berberine    . VITAMIN E PO Take 400 mg by mouth daily.    Marland Kitchen zinc gluconate 50 MG tablet Take 50 mg by mouth daily.     No current facility-administered medications on file prior to visit.    Allergies  Allergen Reactions  . Tetanus Toxoids Other (See Comments)    Symptoms of lockjaw and flu per patient   Family History  Problem Relation Age of Onset  . Arthritis Mother   . Heart disease Mother   . Hypertension Mother   . Alcoholism Father   . Arthritis Father   . Heart disease Father   . Hypertension Father   . Arthritis Sister   . Mental illness Maternal Aunt   . Asthma Brother     PE: BP 136/70   Pulse 74   Ht 5' 3.5" (1.613 m) Comment: measured  Wt 141 lb (64 kg)   SpO2 98%   BMI 24.59 kg/m  Wt Readings from Last 3 Encounters:  04/20/18 141 lb (64 kg)  12/18/17 142 lb (64.4 kg)  11/19/17 145 lb 11.2 oz (66.1 kg)    Constitutional: Normal weight, in NAD Eyes: PERRLA, EOMI, no exophthalmos ENT: moist mucous membranes, no thyromegaly, no cervical lymphadenopathy Cardiovascular: RRR, No MRG Respiratory: CTA B Gastrointestinal: abdomen soft, NT, ND, BS+ Musculoskeletal: no deformities, strength intact in all 4 Skin: moist, warm, no rashes Neurological: no tremor with outstretched hands, DTR normal in all 4   ASSESSMENT: 1. Hypothyroidism -  Unclear if this is a real diagnosis or she was started on thyroid hormones to improve her fatigue  2. Hot flushes  PLAN:  1. Patient with few years history of presumed hypothyroidism, now back on desiccated thyroid extract after she experienced weight gain with levothyroxine.  We initially switched to levothyroxine as she was having anxiety, hot flashes, and palpitations.  She felt better after switching to levothyroxine but then noticed weight gain and she wanted to go back to NT. - latest thyroid labs reviewed with pt >> normal in 10/2017, but these were checked before we switched to the desiccated thyroid extract - she continues on Nature-Throid 65 mg daily - pt feels good on this dose. - we discussed about taking the thyroid hormone every day, with water, >30 minutes before breakfast, separated by >4 hours from acid reflux medications, calcium, iron, multivitamins. Pt. is taking it correctly. - will check thyroid tests today: TSH, fT3 and fT4 - If labs are abnormal, she will need to return for repeat TFTs in 1.5 months  2.  Hot flashes -Most likely secondary to menopause, now on HRT, but with only partial relief -Relizen did not help significantly. Effexor had SEs. Black Cohosh did not help.  -now back on desiccated thyroid extract and we discussed that the T3 part of this can contribute to hot flashes, but she opted to continue with this as she had weight gain with levothyroxine  Component     Latest Ref Rng & Units 04/20/2018  TSH     0.35 - 4.50 uIU/mL  1.40  Triiodothyronine,Free,Serum     2.3 - 4.2 pg/mL 4.0  T4,Free(Direct)     0.60 - 1.60 ng/dL 0.66  Thyroid tests are perfect.  Philemon Kingdom, MD PhD [facility] Endocrin[facility]

## 2018-04-20 NOTE — Patient Instructions (Signed)
Please continue NatureThroid 65 mg daily.  Take the thyroid hormone every day, with water, at least 30 minutes before breakfast, separated by at least 4 hours from: - acid reflux medications - calcium - iron - multivitamins  Please stop at the lab.  Please come back for a follow-up appointment in 1 year.

## 2018-04-22 ENCOUNTER — Ambulatory Visit: Payer: Medicare HMO | Admitting: Internal Medicine

## 2018-04-26 DIAGNOSIS — M5136 Other intervertebral disc degeneration, lumbar region: Secondary | ICD-10-CM | POA: Diagnosis not present

## 2018-04-26 DIAGNOSIS — M545 Low back pain: Secondary | ICD-10-CM | POA: Diagnosis not present

## 2018-04-26 DIAGNOSIS — M25552 Pain in left hip: Secondary | ICD-10-CM | POA: Diagnosis not present

## 2018-04-26 DIAGNOSIS — M5126 Other intervertebral disc displacement, lumbar region: Secondary | ICD-10-CM | POA: Diagnosis not present

## 2018-04-26 DIAGNOSIS — M9903 Segmental and somatic dysfunction of lumbar region: Secondary | ICD-10-CM | POA: Diagnosis not present

## 2018-04-26 DIAGNOSIS — M6240 Contracture of muscle, unspecified site: Secondary | ICD-10-CM | POA: Diagnosis not present

## 2018-04-26 DIAGNOSIS — R293 Abnormal posture: Secondary | ICD-10-CM | POA: Diagnosis not present

## 2018-04-26 DIAGNOSIS — M791 Myalgia, unspecified site: Secondary | ICD-10-CM | POA: Diagnosis not present

## 2018-05-03 DIAGNOSIS — M5136 Other intervertebral disc degeneration, lumbar region: Secondary | ICD-10-CM | POA: Diagnosis not present

## 2018-05-03 DIAGNOSIS — M791 Myalgia, unspecified site: Secondary | ICD-10-CM | POA: Diagnosis not present

## 2018-05-03 DIAGNOSIS — M9903 Segmental and somatic dysfunction of lumbar region: Secondary | ICD-10-CM | POA: Diagnosis not present

## 2018-05-03 DIAGNOSIS — M25552 Pain in left hip: Secondary | ICD-10-CM | POA: Diagnosis not present

## 2018-05-03 DIAGNOSIS — R293 Abnormal posture: Secondary | ICD-10-CM | POA: Diagnosis not present

## 2018-05-03 DIAGNOSIS — M6240 Contracture of muscle, unspecified site: Secondary | ICD-10-CM | POA: Diagnosis not present

## 2018-05-03 DIAGNOSIS — M5126 Other intervertebral disc displacement, lumbar region: Secondary | ICD-10-CM | POA: Diagnosis not present

## 2018-05-10 DIAGNOSIS — R293 Abnormal posture: Secondary | ICD-10-CM | POA: Diagnosis not present

## 2018-05-10 DIAGNOSIS — M5136 Other intervertebral disc degeneration, lumbar region: Secondary | ICD-10-CM | POA: Diagnosis not present

## 2018-05-10 DIAGNOSIS — M791 Myalgia, unspecified site: Secondary | ICD-10-CM | POA: Diagnosis not present

## 2018-05-10 DIAGNOSIS — M6240 Contracture of muscle, unspecified site: Secondary | ICD-10-CM | POA: Diagnosis not present

## 2018-05-10 DIAGNOSIS — M25552 Pain in left hip: Secondary | ICD-10-CM | POA: Diagnosis not present

## 2018-05-10 DIAGNOSIS — M9903 Segmental and somatic dysfunction of lumbar region: Secondary | ICD-10-CM | POA: Diagnosis not present

## 2018-05-10 DIAGNOSIS — M5126 Other intervertebral disc displacement, lumbar region: Secondary | ICD-10-CM | POA: Diagnosis not present

## 2018-05-19 DIAGNOSIS — M791 Myalgia, unspecified site: Secondary | ICD-10-CM | POA: Diagnosis not present

## 2018-05-19 DIAGNOSIS — M5126 Other intervertebral disc displacement, lumbar region: Secondary | ICD-10-CM | POA: Diagnosis not present

## 2018-05-19 DIAGNOSIS — R293 Abnormal posture: Secondary | ICD-10-CM | POA: Diagnosis not present

## 2018-05-19 DIAGNOSIS — M9903 Segmental and somatic dysfunction of lumbar region: Secondary | ICD-10-CM | POA: Diagnosis not present

## 2018-05-19 DIAGNOSIS — M6240 Contracture of muscle, unspecified site: Secondary | ICD-10-CM | POA: Diagnosis not present

## 2018-05-19 DIAGNOSIS — M25552 Pain in left hip: Secondary | ICD-10-CM | POA: Diagnosis not present

## 2018-05-19 DIAGNOSIS — M5136 Other intervertebral disc degeneration, lumbar region: Secondary | ICD-10-CM | POA: Diagnosis not present

## 2018-05-24 DIAGNOSIS — R293 Abnormal posture: Secondary | ICD-10-CM | POA: Diagnosis not present

## 2018-05-24 DIAGNOSIS — M791 Myalgia, unspecified site: Secondary | ICD-10-CM | POA: Diagnosis not present

## 2018-05-24 DIAGNOSIS — M9903 Segmental and somatic dysfunction of lumbar region: Secondary | ICD-10-CM | POA: Diagnosis not present

## 2018-05-24 DIAGNOSIS — M6240 Contracture of muscle, unspecified site: Secondary | ICD-10-CM | POA: Diagnosis not present

## 2018-05-24 DIAGNOSIS — M25552 Pain in left hip: Secondary | ICD-10-CM | POA: Diagnosis not present

## 2018-05-24 DIAGNOSIS — M5136 Other intervertebral disc degeneration, lumbar region: Secondary | ICD-10-CM | POA: Diagnosis not present

## 2018-05-24 DIAGNOSIS — M5126 Other intervertebral disc displacement, lumbar region: Secondary | ICD-10-CM | POA: Diagnosis not present

## 2018-05-28 ENCOUNTER — Encounter: Payer: Self-pay | Admitting: Psychiatry

## 2018-05-28 ENCOUNTER — Ambulatory Visit: Payer: Medicare HMO | Admitting: Psychiatry

## 2018-05-28 DIAGNOSIS — F411 Generalized anxiety disorder: Secondary | ICD-10-CM | POA: Diagnosis not present

## 2018-05-28 DIAGNOSIS — R69 Illness, unspecified: Secondary | ICD-10-CM | POA: Diagnosis not present

## 2018-05-28 NOTE — Progress Notes (Signed)
      [facility] Counselor/Therapist Progress Note  Patient ID: Adaiah Jaskot, MRN: [id number],    Date: 05/28/2018  Time Spent: 48 minutes   Treatment Type: Individual Therapy  Reported Symptoms: Anxious Mood  Mental Status Exam:  Appearance:   Well Groomed     Behavior:  Appropriate  Motor:  Normal  Speech/Language:   Clear and Coherent  Affect:  Appropriate  Mood:  anxious  Thought process:  normal  Thought content:    WNL  Sensory/Perceptual disturbances:    WNL  Orientation:  oriented to person, place, time/date and situation  Attention:  Good  Concentration:  Good  Memory:  WNL  Fund of knowledge:   Good  Insight:    Good  Judgment:   Good  Impulse Control:  Good   Risk Assessment: Danger to Self:  No Self-injurious Behavior: No Danger to Others: No Duty to Warn:no Physical Aggression / Violence:No  Access to Firearms a concern: No  Gang Involvement:No   Subjective: The client reports that she did not make it to church as much as she had wanted.  She and her husband did visit a new church that they think they might settle in.  The client has tried to focus on others rather than herself.  This has been difficult she says but it has reduced her anxiety.  She states, "I am trying to love others like Jesus would." We discussed at length what is like to be other focused.  This can be good as long she still has good boundaries and can be appropriately assertive at times.  The client sees that putting her own "selfishness to death ".  Has been having a beneficial effect.  She notes, "I am learning to let go."  The client continues with self care, assertiveness and boundaries.  Interventions: Assertiveness/Communication, Solution-Oriented/Positive Psychology, Eye Movement Desensitization and Reprocessing (EMDR) and Insight-Oriented  Diagnosis:   ICD-10-CM   1. Generalized anxiety disorder F41.1     Plan: Assertiveness, boundaries, self-care.  Albertina Parr Stepehn Eckard,  [place]

## 2018-05-31 DIAGNOSIS — M791 Myalgia, unspecified site: Secondary | ICD-10-CM | POA: Diagnosis not present

## 2018-05-31 DIAGNOSIS — M5126 Other intervertebral disc displacement, lumbar region: Secondary | ICD-10-CM | POA: Diagnosis not present

## 2018-05-31 DIAGNOSIS — M9903 Segmental and somatic dysfunction of lumbar region: Secondary | ICD-10-CM | POA: Diagnosis not present

## 2018-05-31 DIAGNOSIS — R293 Abnormal posture: Secondary | ICD-10-CM | POA: Diagnosis not present

## 2018-05-31 DIAGNOSIS — M5136 Other intervertebral disc degeneration, lumbar region: Secondary | ICD-10-CM | POA: Diagnosis not present

## 2018-05-31 DIAGNOSIS — M6240 Contracture of muscle, unspecified site: Secondary | ICD-10-CM | POA: Diagnosis not present

## 2018-05-31 DIAGNOSIS — M25552 Pain in left hip: Secondary | ICD-10-CM | POA: Diagnosis not present

## 2018-06-21 DIAGNOSIS — M6240 Contracture of muscle, unspecified site: Secondary | ICD-10-CM | POA: Diagnosis not present

## 2018-06-21 DIAGNOSIS — M5136 Other intervertebral disc degeneration, lumbar region: Secondary | ICD-10-CM | POA: Diagnosis not present

## 2018-06-21 DIAGNOSIS — R293 Abnormal posture: Secondary | ICD-10-CM | POA: Diagnosis not present

## 2018-06-21 DIAGNOSIS — M5126 Other intervertebral disc displacement, lumbar region: Secondary | ICD-10-CM | POA: Diagnosis not present

## 2018-06-21 DIAGNOSIS — M791 Myalgia, unspecified site: Secondary | ICD-10-CM | POA: Diagnosis not present

## 2018-06-21 DIAGNOSIS — M25552 Pain in left hip: Secondary | ICD-10-CM | POA: Diagnosis not present

## 2018-06-21 DIAGNOSIS — M9903 Segmental and somatic dysfunction of lumbar region: Secondary | ICD-10-CM | POA: Diagnosis not present

## 2018-06-28 DIAGNOSIS — M5136 Other intervertebral disc degeneration, lumbar region: Secondary | ICD-10-CM | POA: Diagnosis not present

## 2018-06-28 DIAGNOSIS — M6240 Contracture of muscle, unspecified site: Secondary | ICD-10-CM | POA: Diagnosis not present

## 2018-06-28 DIAGNOSIS — M9903 Segmental and somatic dysfunction of lumbar region: Secondary | ICD-10-CM | POA: Diagnosis not present

## 2018-06-28 DIAGNOSIS — M5126 Other intervertebral disc displacement, lumbar region: Secondary | ICD-10-CM | POA: Diagnosis not present

## 2018-06-28 DIAGNOSIS — R293 Abnormal posture: Secondary | ICD-10-CM | POA: Diagnosis not present

## 2018-06-28 DIAGNOSIS — M25552 Pain in left hip: Secondary | ICD-10-CM | POA: Diagnosis not present

## 2018-06-28 DIAGNOSIS — M791 Myalgia, unspecified site: Secondary | ICD-10-CM | POA: Diagnosis not present

## 2018-06-30 DIAGNOSIS — L570 Actinic keratosis: Secondary | ICD-10-CM | POA: Diagnosis not present

## 2018-06-30 DIAGNOSIS — D2261 Melanocytic nevi of right upper limb, including shoulder: Secondary | ICD-10-CM | POA: Diagnosis not present

## 2018-06-30 DIAGNOSIS — L821 Other seborrheic keratosis: Secondary | ICD-10-CM | POA: Diagnosis not present

## 2018-06-30 DIAGNOSIS — D225 Melanocytic nevi of trunk: Secondary | ICD-10-CM | POA: Diagnosis not present

## 2018-06-30 DIAGNOSIS — D2272 Melanocytic nevi of left lower limb, including hip: Secondary | ICD-10-CM | POA: Diagnosis not present

## 2018-06-30 DIAGNOSIS — Z85820 Personal history of malignant melanoma of skin: Secondary | ICD-10-CM | POA: Diagnosis not present

## 2018-06-30 DIAGNOSIS — D2262 Melanocytic nevi of left upper limb, including shoulder: Secondary | ICD-10-CM | POA: Diagnosis not present

## 2018-06-30 DIAGNOSIS — D2271 Melanocytic nevi of right lower limb, including hip: Secondary | ICD-10-CM | POA: Diagnosis not present

## 2018-06-30 DIAGNOSIS — D1801 Hemangioma of skin and subcutaneous tissue: Secondary | ICD-10-CM | POA: Diagnosis not present

## 2018-06-30 DIAGNOSIS — L814 Other melanin hyperpigmentation: Secondary | ICD-10-CM | POA: Diagnosis not present

## 2018-07-05 DIAGNOSIS — R293 Abnormal posture: Secondary | ICD-10-CM | POA: Diagnosis not present

## 2018-07-05 DIAGNOSIS — M5136 Other intervertebral disc degeneration, lumbar region: Secondary | ICD-10-CM | POA: Diagnosis not present

## 2018-07-05 DIAGNOSIS — M6240 Contracture of muscle, unspecified site: Secondary | ICD-10-CM | POA: Diagnosis not present

## 2018-07-05 DIAGNOSIS — M9903 Segmental and somatic dysfunction of lumbar region: Secondary | ICD-10-CM | POA: Diagnosis not present

## 2018-07-05 DIAGNOSIS — M5126 Other intervertebral disc displacement, lumbar region: Secondary | ICD-10-CM | POA: Diagnosis not present

## 2018-07-05 DIAGNOSIS — M25552 Pain in left hip: Secondary | ICD-10-CM | POA: Diagnosis not present

## 2018-07-05 DIAGNOSIS — M791 Myalgia, unspecified site: Secondary | ICD-10-CM | POA: Diagnosis not present

## 2018-07-12 ENCOUNTER — Ambulatory Visit: Payer: Medicare HMO | Admitting: Psychiatry

## 2018-07-13 ENCOUNTER — Encounter: Payer: Self-pay | Admitting: Psychiatry

## 2018-07-13 ENCOUNTER — Ambulatory Visit: Payer: Medicare HMO | Admitting: Psychiatry

## 2018-07-13 DIAGNOSIS — R69 Illness, unspecified: Secondary | ICD-10-CM | POA: Diagnosis not present

## 2018-07-13 DIAGNOSIS — F411 Generalized anxiety disorder: Secondary | ICD-10-CM | POA: Diagnosis not present

## 2018-07-13 NOTE — Progress Notes (Signed)
      [facility] Counselor/Therapist Progress Note  Patient ID: Olivia Morrison, MRN: [id number],    Date: 07/13/2018  Time Spent: 51 minute   Treatment Type: Individual Therapy  Reported Symptoms: Anxious Mood  Mental Status Exam:  Appearance:   Well Groomed     Behavior:  Appropriate  Motor:  Normal  Speech/Language:   Clear and Coherent  Affect:  Appropriate  Mood:  anxious  Thought process:  normal  Thought content:    WNL  Sensory/Perceptual disturbances:    WNL  Orientation:  oriented to person, place, time/date and situation  Attention:  Good  Concentration:  Good  Memory:  WNL Immediate;   Good  Fund of knowledge:   Good  Insight:    Good  Judgment:   Good  Impulse Control:  Good   Risk Assessment: Danger to Self:  No Self-injurious Behavior: No Danger to Others: No Duty to Warn:no Physical Aggression / Violence:No  Access to Firearms a concern: No  Gang Involvement:No   Subjective: The client states that she has not gone to church as we had discussed since last session.  She says it is difficult because her husband sleeps sitting on Sunday morning and does not seem to be as interested as she is.  The client went on to complain how much she does for everyone else especially her husband.  She attends all the sporting events that he wants.  We discussed how she could be more assertive with her husband asking him to do the morning devotions with her on Sundays.  She states he tells her that she is "selfish."  I proposed to the client then maybe it was not she being selfish but him just not wanting to do it.  She will consider that. The client agrees that she needs to be more proactive.  She asked the question today "why does God have me here?"  I posited to the client Olivia Morrison be it is for her to develop more initiation.  The client's whole life has been punctuated by passive and S.  Olivia Morrison be now it is time to quit being passive.  Interventions: Assertiveness/Communication,  Solution-Oriented/Positive Psychology and Insight-Oriented  Diagnosis:   ICD-10-CM   1. Generalized anxiety disorder F41.1     Plan: Assertiveness, acceptance, initiate.  Albertina Parr [place], [place]

## 2018-07-21 NOTE — Progress Notes (Signed)
Medicare Annual Preventive Care Visit  (initial annual wellness or annual wellness exam)  Concerns and/or follow up today: Due for dexa, ? prevnar 13 or just pneumococcal, flu vaccine, labs, ? Tetanus booster She refuses all vaccines today. Agrees to dexa. She is sees a Restaurant manager, fast food whom did adrenal testing and she now is taking a number of supplement which she feels have helped with her hot flashes. Has ongoing hip pain. She wants to check a bunch of labs her chiropractor recommended including lipids, hgba1c, vit D, cbc and CMP - she wishes to do regardless of insurance coverage.   Olivia Morrison is a pleasant 68 y.o. here for follow up. Chronic medical problems summarized below were reviewed for changes. Denies CP, SOB, DOE, treatment intolerance or new symptoms.  Anxiety and Depression: -hx recurrent depression -has counselor for CBT, tried effexor - gave her vivid dreams but did help her depression and hot flashes, but did not want to continue -she feels is much better on supplements from chiropractor, reports will only take for 3 months, then chiropractor is treating with diet -strong Christian faith  L hip "bursitis": -reports occ L lat hip pain chronically -used to see chiropractor -no weakness, numbness -[facility] helped in the past, PT helped, discussed referral and acupuncture last visit for this, today agrees to referral  Hypothyroidism: -seeing integrative specialist, endo checks per her report - declines check here  Hyperglycemia: -mild prediabetes range hgba1c -lifestyle recs advised  Hot flashes: -seeing endocrinologist -wants to consider other options as nothing has worked -interferes with her sleep - requests klonopin for this  Hearing loss: -sees ENT/Audiology at [facility]  See HM section in Epic for other details of completed HM. See scanned documentation under Media Tab for further documentation HPI, health risk assessment. See Media Tab and Care Teams sections in  Epic for other providers.  ROS: negative for report of fevers, unintentional weight loss, vision changes, vision loss, hearing loss or change, chest pain, sob, hemoptysis, melena, hematochezia, hematuria, genital discharge or lesions, falls, bleeding or bruising, loc, thoughts of suicide or self harm, memory loss  1.) Patient-completed health risk assessment  - completed and reviewed, see scanned documentation  2.) Review of Medical History: -PMH, PSH, Family History and current specialty and care providers reviewed and updated and listed below  - see scanned in document in chart and below  Past Medical History:  Diagnosis Date  . Abnormal stress test    2016, saw cards, nuclear scan planned  . Depression    seeing [name] - psychologist  . GERD (gastroesophageal reflux disease)   . Melanoma ([place])    in 2016, removed; seeing Dr. [name]    Past Surgical History:  Procedure Laterality Date  . ABDOMINAL HYSTERECTOMY  1993   leiomyomata, bleeding  . BREAST BIOPSY Right 2015  . Eye lid surg    . Melanoma excised    . TONSILLECTOMY  1959    Social History   Socioeconomic History  . Marital status: Married    Spouse name: Not on file  . Number of children: Not on file  . Years of education: Not on file  . Highest education level: Not on file  Occupational History  . Not on file  Social Needs  . Financial resource strain: Not on file  . Food insecurity:    Worry: Not on file    Inability: Not on file  . Transportation needs:    Medical: Not on file    Non-medical: Not  on file  Tobacco Use  . Smoking status: Former Smoker    Packs/day: 0.75    Years: 5.00    Pack years: 3.75    Types: Cigarettes    Last attempt to quit: 04/16/2007    Years since quitting: 11.2  . Smokeless tobacco: Never Used  Substance and Sexual Activity  . Alcohol use: Yes    Alcohol/week: 0.0 standard drinks    Comment: 1-2 drinks over the weekend, previously 1 drink nightly (martini)  . Drug  use: No  . Sexual activity: Yes    Birth control/protection: Post-menopausal    Comment: 1st intercourse 68 yo-More than 5 partners  Lifestyle  . Physical activity:    Days per week: Not on file    Minutes per session: Not on file  . Stress: Not on file  Relationships  . Social connections:    Talks on phone: Not on file    Gets together: Not on file    Attends religious service: Not on file    Active member of club or organization: Not on file    Attends meetings of clubs or organizations: Not on file    Relationship status: Not on file  . Intimate partner violence:    Fear of current or ex partner: Not on file    Emotionally abused: Not on file    Physically abused: Not on file    Forced sexual activity: Not on file  Other Topics Concern  . Not on file  Social History Narrative   Lives with husband in a 2 story home.  Has 2 sons.     Not currently working.  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    Education: some college.    Family History  Problem Relation Age of Onset  . Arthritis Mother   . Heart disease Mother   . Hypertension Mother   . Alcoholism Father   . Arthritis Father   . Heart disease Father   . Hypertension Father   . Arthritis Sister   . Mental illness Maternal Aunt   . Asthma Brother     Current Outpatient Medications on File Prior to Visit  Medication Sig Dispense Refill  . Cholecalciferol (VITAMIN D3) 5000 units CAPS Take by mouth.    . Coenzyme Q10 (CO Q 10 PO) Take by mouth.    . estradiol (CLIMARA - DOSED IN MG/24 HR) 0.0375 mg/24hr patch Place 1 patch (0.0375 mg total) onto the skin once a week. 4 patch 12  . MAGNESIUM CITRATE PO Take 150 mg by mouth.    . Menaquinone-7 (VITAMIN K2 PO) Take by mouth.    . Multiple Vitamins-Minerals (ZINC PO) Take by mouth.    Marland Kitchen NATURE-THROID 65 MG tablet Take 1 tablet (65 mg total) by mouth daily. 60 tablet 3  . NON FORMULARY Relizen herbal supplement for hot flashes    . Nutritional  Supplements (JUICE PLUS FIBRE PO) Take by mouth.    Marland Kitchen OVER THE COUNTER MEDICATION Berberine    . VITAMIN E PO Take 400 mg by mouth daily.    Marland Kitchen zinc gluconate 50 MG tablet Take 50 mg by mouth daily.     No current facility-administered medications on file prior to visit.      3.) Review of functional ability and level of safety:  Any difficulty hearing?  See scanned documentation  History of falling?  See scanned documentation  Any trouble with IADLs - using a phone, using transportation, grocery  shopping, preparing meals, doing housework, doing laundry, taking medications and managing money?  See scanned documentation  Advance Directives?  Discussed briefly and advised.   See summary of recommendations in Patient Instructions below.  4.) Physical Exam Vitals:   07/22/18 0859  BP: 100/64  Pulse: 60  Temp: 98.1 F (36.7 C)   Estimated body mass index is 23.81 kg/m as calculated from the following:   Height as of this encounter: 5' 4.25" (1.632 m).   Weight as of this encounter: 139 lb 12.8 oz (63.4 kg).  EKG (optional): deferred  General: alert, appear well hydrated and in no acute distress  HEENT: visual acuity grossly intact, see eye exam in EPIC  CV: HRRR  Lungs: CTA bilaterally  ABD: BS+, soft, NTTP  Psych: pleasant and cooperative, no obvious depression or anxiety  Cognitive function grossly intact  See patient instructions for recommendations.  Education and counseling regarding the above review of health provided with a plan for the following: -see scanned patient completed form for further details -fall prevention strategies discussed  -healthy lifestyle discussed -importance and resources for completing advanced directives discussed -see patient instructions below for any other recommendations provided  4)The following written screening schedule of preventive measures were reviewed with assessment and plan made per below, orders and patient  instructions:      AAA screening done if applicable     Alcohol screening done     Obesity Screening and counseling done     STI screening (Hep C if born 1943-65) offered and per pt wishes     Tobacco Screening done       Pneumococcal (PPSV23 -one dose after 64, one before if risk factors), influenza yearly and hepatitis B vaccines (if high risk - end stage renal disease, IV drugs, homosexual men, live in home for mentally retarded, hemophilia receiving factors) ASSESSMENT/PLAN: refuses      Screening mammograph (yearly if >40) ASSESSMENT/PLAN: utd - done 03/2017 - reports does with gyn      Screening Pap smear/pelvic exam (q2 years) ASSESSMENT/PLAN: n/a, declined - done 03/2017 with neg hpv, sees gyn      Colorectal cancer screening (FOBT yearly or flex sig q4y or colonoscopy q10y or barium enema q4y) ASSESSMENT/PLAN: utd, 05/14/15 with Dr. Collene Mares, [facility]      Diabetes outpatient self-management training services ASSESSMENT/PLAN: utd or done      Bone mass measurements(covered q2y if indicated - estrogen def, osteoporosis, hyperparathyroid, vertebral abnormalities, osteoporosis or steroids) ASSESSMENT/PLAN: assistant advised to order      Screening for glaucoma(q1y if high risk - diabetes, FH, AA and > 50 or hispanic and > 65) ASSESSMENT/PLAN: utd or advised      Medical nutritional therapy for individuals with diabetes or renal disease ASSESSMENT/PLAN: see orders      Cardiovascular screening blood tests (lipids q5y) ASSESSMENT/PLAN: see orders and labs      Diabetes screening tests ASSESSMENT/PLAN: see orders and labs   7.) Summary:  Medicare annual wellness visit, initial -risk factors and conditions per above assessment were discussed and treatment, recommendations and referrals were offered per documentation above and orders and patient instructions.  Hyperlipidemia, unspecified hyperlipidemia type - Plan: Lipid panel  Left hip pain - Plan: CBC with  Differential/Platelet, Comprehensive metabolic panel -recommended ortho eval, she agreed to referral today, placed -advised she call if did not have ortho appt details in 1-2 weeks  Vitamin D deficiency - Plan: VITAMIN D 25 Hydroxy (Vit-D Deficiency, Fractures)  Hyperglycemia - Plan: Hemoglobin  A1c  Menopausal hot flushes - seeing chiropractor and endo for this, check cMP and cbc given on a number of supplements  Estrogen deficiency - Plan: DG Bone Density  Patient Instructions   BEFORE YOU LEAVE: -medicare wellness form -order dexa for estrogen def -labs -follow up: 4-6 months  -We placed a referral for you as discussed to the orthopedic specialist. It usually takes about 1-2 weeks to process and schedule this referral. If you have not heard from [place] regarding this appointment in 2 weeks please contact our office.  We have ordered labs or studies at this visit. It can take up to 1-2 weeks for results and processing. IF results require follow up or explanation, we will call you with instructions. Clinically stable results will be released to your [facility]. If you have not heard from [place] or cannot find your results in [facility] in 2 weeks please contact our office at [phone].  If you are not yet signed up for [facility], please consider signing up.   Ms. Kavanagh , Thank you for taking time to come for your Medicare Wellness Visit. I appreciate your ongoing commitment to your health goals. Please review the following plan we discussed and let me know if I can assist you in the future.   These are the goals we discussed: Goals   None     This is a list of the screening recommended for you and due dates:  Health Maintenance  Topic Date Due  . DEXA scan (bone density measurement)  Ordered today  . Flu Shot  07/23/2019* declined - pt preference  . Pneumonia vaccines (1 of 2 - PCV13) 07/23/2019* declined - pt preference  . Tetanus Vaccine  03/05/2025* declined - pt preference  . HIV  Screening  03/05/2025* declined - pt preference  . Mammogram  03/20/2019  . Pap Smear  03/30/2020  . Colon Cancer Screening  06/16/2024  .  Hepatitis C: One time screening is recommended by Center for Disease Control  (CDC) for  adults born from [birth year] through 1963.   Completed  *Topic was postponed. The date shown is not the original due date.          Lucretia Kern, DO

## 2018-07-22 ENCOUNTER — Encounter: Payer: Self-pay | Admitting: Family Medicine

## 2018-07-22 ENCOUNTER — Ambulatory Visit (INDEPENDENT_AMBULATORY_CARE_PROVIDER_SITE_OTHER): Payer: Medicare HMO | Admitting: Family Medicine

## 2018-07-22 VITALS — BP 100/64 | HR 60 | Temp 98.1°F | Ht 64.25 in | Wt 139.8 lb

## 2018-07-22 DIAGNOSIS — N951 Menopausal and female climacteric states: Secondary | ICD-10-CM

## 2018-07-22 DIAGNOSIS — E785 Hyperlipidemia, unspecified: Secondary | ICD-10-CM | POA: Diagnosis not present

## 2018-07-22 DIAGNOSIS — M25552 Pain in left hip: Secondary | ICD-10-CM

## 2018-07-22 DIAGNOSIS — R739 Hyperglycemia, unspecified: Secondary | ICD-10-CM | POA: Diagnosis not present

## 2018-07-22 DIAGNOSIS — E2839 Other primary ovarian failure: Secondary | ICD-10-CM

## 2018-07-22 DIAGNOSIS — Z0000 Encounter for general adult medical examination without abnormal findings: Secondary | ICD-10-CM | POA: Diagnosis not present

## 2018-07-22 DIAGNOSIS — E559 Vitamin D deficiency, unspecified: Secondary | ICD-10-CM | POA: Diagnosis not present

## 2018-07-22 LAB — CBC WITH DIFFERENTIAL/PLATELET
BASOS ABS: 0 10*3/uL (ref 0.0–0.1)
Basophils Relative: 0.5 % (ref 0.0–3.0)
EOS PCT: 1.7 % (ref 0.0–5.0)
Eosinophils Absolute: 0.1 10*3/uL (ref 0.0–0.7)
HEMATOCRIT: 40.6 % (ref 36.0–46.0)
Hemoglobin: 13.6 g/dL (ref 12.0–15.0)
LYMPHS PCT: 38 % (ref 12.0–46.0)
Lymphs Abs: 2.7 10*3/uL (ref 0.7–4.0)
MCHC: 33.4 g/dL (ref 30.0–36.0)
MCV: 89.5 fl (ref 78.0–100.0)
MONOS PCT: 9.4 % (ref 3.0–12.0)
Monocytes Absolute: 0.7 10*3/uL (ref 0.1–1.0)
Neutro Abs: 3.5 10*3/uL (ref 1.4–7.7)
Neutrophils Relative %: 50.4 % (ref 43.0–77.0)
PLATELETS: 279 10*3/uL (ref 150.0–400.0)
RBC: 4.54 Mil/uL (ref 3.87–5.11)
RDW: 13.7 % (ref 11.5–15.5)
WBC: 7 10*3/uL (ref 4.0–10.5)

## 2018-07-22 LAB — COMPREHENSIVE METABOLIC PANEL
ALK PHOS: 67 U/L (ref 39–117)
ALT: 23 U/L (ref 0–35)
AST: 27 U/L (ref 0–37)
Albumin: 4.9 g/dL (ref 3.5–5.2)
BUN: 16 mg/dL (ref 6–23)
CALCIUM: 10.1 mg/dL (ref 8.4–10.5)
CO2: 27 meq/L (ref 19–32)
Chloride: 102 mEq/L (ref 96–112)
Creatinine, Ser: 0.69 mg/dL (ref 0.40–1.20)
GFR: 85.17 mL/min (ref >60.00)
Glucose, Bld: 81 mg/dL (ref 70–99)
POTASSIUM: 5 meq/L (ref 3.5–5.1)
Sodium: 139 mEq/L (ref 135–145)
TOTAL PROTEIN: 7.2 g/dL (ref 6.0–8.3)
Total Bilirubin: 0.4 mg/dL (ref 0.2–1.2)

## 2018-07-22 LAB — LIPID PANEL
CHOLESTEROL: 177 mg/dL (ref 0–200)
HDL: 53.8 mg/dL (ref >39.00)
LDL Cholesterol: 108 mg/dL — ABNORMAL HIGH (ref 0–99)
NONHDL: 123.35
TRIGLYCERIDES: 79 mg/dL (ref 0.0–149.0)
Total CHOL/HDL Ratio: 3
VLDL: 15.8 mg/dL (ref 0.0–40.0)

## 2018-07-22 LAB — VITAMIN D 25 HYDROXY (VIT D DEFICIENCY, FRACTURES): VITD: 65.83 ng/mL (ref 30.00–100.00)

## 2018-07-22 LAB — HEMOGLOBIN A1C: Hgb A1c MFr Bld: 5.8 % (ref 4.6–6.5)

## 2018-07-22 NOTE — Patient Instructions (Signed)
BEFORE YOU LEAVE: -medicare wellness form -order dexa for estrogen def -labs -follow up: 4-6 months  -We placed a referral for you as discussed to the orthopedic specialist. It usually takes about 1-2 weeks to process and schedule this referral. If you have not heard from [place] regarding this appointment in 2 weeks please contact our office.  We have ordered labs or studies at this visit. It can take up to 1-2 weeks for results and processing. IF results require follow up or explanation, we will call you with instructions. Clinically stable results will be released to your [facility]. If you have not heard from [place] or cannot find your results in [facility] in 2 weeks please contact our office at [phone].  If you are not yet signed up for [facility], please consider signing up.   Olivia Morrison , Thank you for taking time to come for your Medicare Wellness Visit. I appreciate your ongoing commitment to your health goals. Please review the following plan we discussed and let me know if I can assist you in the future.   These are the goals we discussed: Goals   None     This is a list of the screening recommended for you and due dates:  Health Maintenance  Topic Date Due  . DEXA scan (bone density measurement)  Ordered today  . Flu Shot  07/23/2019* declined - pt preference  . Pneumonia vaccines (1 of 2 - PCV13) 07/23/2019* declined - pt preference  . Tetanus Vaccine  03/05/2025* declined - pt preference  . HIV Screening  03/05/2025* declined - pt preference  . Mammogram  03/20/2019  . Pap Smear  03/30/2020  . Colon Cancer Screening  06/16/2024  .  Hepatitis C: One time screening is recommended by Center for Disease Control  (CDC) for  adults born from [birth year] through 1963.   Completed  *Topic was postponed. The date shown is not the original due date.

## 2018-07-28 ENCOUNTER — Inpatient Hospital Stay: Admission: RE | Admit: 2018-07-28 | Payer: Medicare HMO | Source: Ambulatory Visit

## 2018-08-03 DIAGNOSIS — M7061 Trochanteric bursitis, right hip: Secondary | ICD-10-CM | POA: Diagnosis not present

## 2018-08-03 DIAGNOSIS — M25551 Pain in right hip: Secondary | ICD-10-CM | POA: Diagnosis not present

## 2018-08-03 DIAGNOSIS — M25552 Pain in left hip: Secondary | ICD-10-CM | POA: Diagnosis not present

## 2018-08-03 DIAGNOSIS — M7062 Trochanteric bursitis, left hip: Secondary | ICD-10-CM | POA: Diagnosis not present

## 2018-08-17 ENCOUNTER — Ambulatory Visit: Payer: Medicare HMO | Attending: Orthopedic Surgery | Admitting: Physical Therapy

## 2018-08-17 ENCOUNTER — Other Ambulatory Visit: Payer: Self-pay

## 2018-08-17 DIAGNOSIS — M25552 Pain in left hip: Secondary | ICD-10-CM

## 2018-08-17 DIAGNOSIS — M25551 Pain in right hip: Secondary | ICD-10-CM | POA: Diagnosis not present

## 2018-08-17 NOTE — Patient Instructions (Signed)
Access Code: [email or web address]  URL: [email or web address]  Date: 08/17/2018  Prepared by: Almyra Free Jomari Bartnik   Exercises  Supine Hamstring Stretch with Strap - 10 reps - 3 sets - 1x daily - 7x weekly  Supine Piriformis Stretch - 3 reps - 1 sets - 30 sec hold - 1x daily - 7x weekly  Supine Bilateral Hip Internal Rotation Stretch - 3 reps - 3 sets - 1x daily - 7x weekly  Sidelying Hip Abduction - 10 reps - 1 sets - 1x daily - 7x weekly  Sidelying Hip Adduction with Ankle Weight - Leg Behind - 10 reps - 1 sets - 1x daily - 7x weekly  Reverse Clamshell in Extension and Abduction - 10 reps - 1 sets - 1x daily - 7x weekly  Supine Bridge with Resistance Band - 10 reps - 2 sets - 1x daily - 7x weekly  Sidelying Reverse Clamshell - 10 reps - 3 sets - 1x daily - 7x weekly  Prone Alternating Arm and Leg Lifts - 10 reps - 1 sets - 1x daily - 7x weekly  Standing Repeated Hip Flexion with Resistance - 10 reps - 1 sets - 1x daily - 7x weekly  Standing Repeated Hip Adduction with Resistance - 10 reps - 3 sets - 1x daily - 7x weekly  Standing Repeated Hip Abduction with Resistance - 10 reps - 1 sets - 1x daily - 7x weekly  Wall Squat with Swiss Ball and Resistance Loop - 10 reps - 2 sets - 5 sec hold - 1x daily - 7x weekly  Sidelying Quadriceps Stretch - 3 reps - 1 sets - 30-60 seconds hold - 2x daily - 7x weekly  Half Kneeling Hip Flexor Stretch with Sidebend - 3 reps - 30-60 sets - 2x daily - 7x weekly

## 2018-08-17 NOTE — Therapy (Signed)
[name], [name], [address] Phone: [phone]   Fax:  [phone]  Physical Therapy Evaluation  Patient Details  Name: Olivia Morrison MRN: [id number] Date of Birth: 08-Dec-1950 Referring Provider (PT): [place]   Encounter Date: 08/17/2018  PT End of Session - 08/17/18 0934    Visit Number  1    Date for PT Re-Evaluation  09/14/18    PT Start Time  0934    PT Stop Time  1017    PT Time Calculation (min)  43 min    Activity Tolerance  Patient tolerated treatment well    Behavior During Therapy  [facility] for tasks assessed/performed       Past Medical History:  Diagnosis Date  . Abnormal stress test    2016, saw cards, nuclear scan planned  . Depression    seeing [name] - psychologist  . GERD (gastroesophageal reflux disease)   . Melanoma (Olivia Morrison)    in 2016, removed; seeing Dr. [name]    Past Surgical History:  Procedure Laterality Date  . ABDOMINAL HYSTERECTOMY  1993   leiomyomata, bleeding  . BREAST BIOPSY Right 2015  . Eye lid surg    . Melanoma excised    . TONSILLECTOMY  1959    There were no vitals filed for this visit.   Subjective Assessment - 08/17/18 0939    Subjective  Patient has had bil hip pain for months and was seen at our [place] clinic for 5-6 weeks in the summer. She reports she has not been compliant with HEP. She had some relief, but pain continued intermittently. Left hip is worse than right. She is starting an anti-inflammatory diet about 3 weeks ago. Two days she reported a burning feeling in both legs.  Sees chiropractor biweekly.    Pertinent History  LBP, thyroid    Diagnostic tests  xrays hips normal     Patient Stated Goals  to get rid of pain    Currently in Pain?  Yes    Pain Score  2     Pain Location  Hip    Pain Orientation  Left    Pain Descriptors / Indicators  Aching;Dull    Pain Type  Chronic pain    Pain Radiating Towards  into left lateral  thigh    Pain Onset  More than a month ago    Pain Frequency  Intermittent    Aggravating Factors   inactivity    Pain Relieving Factors  stretching         [facility] PT Assessment - 08/17/18 0001      Assessment   Medical Diagnosis  bil hip bursitis    Referring Provider (PT)  [place]    Onset Date/Surgical Date  08/03/18    Prior Therapy  yes      Precautions   Precautions  None      Restrictions   Weight Bearing Restrictions  No      Balance Screen   Has the patient fallen in the past 6 months  No    Has the patient had a decrease in activity level because of a fear of falling?   No    Is the patient reluctant to leave their home because of a fear of falling?   No      Home Film/video editor residence      Prior Function   Level of Independence  Independent  Leisure  takes care of 4 grandkids      Posture/Postural Control   Posture Comments  WNL, mild forward head      ROM / Strength   AROM / PROM / Strength  AROM;Strength      AROM   Overall AROM Comments  Lumbar WNL except left rotation 60%, Bil hips WNL      Strength   Overall Strength Comments  bil hip flex, ABD, ext 5/5; bil knee ext/flex 5/5      Flexibility   Soft Tissue Assessment /Muscle Length  yes    Hamstrings  mild bil HS    Quadriceps  tight bil    ITB  WNL bil    Piriformis  Rt tight    Quadratus Lumborum  right tight and right paraspinasl      Palpation   Spinal mobility  tender with PA mobs iin right lumbar    SI assessment   --    Palpation comment  bil gluteals along IC, ITB and right paraspinals                Objective measurements completed on examination: See above findings.              PT Education - 08/17/18 1231    Education Details  HEP    Person(s) Educated  Patient    Methods  Explanation;Demonstration;Handout    Comprehension  Verbalized understanding;Returned demonstration       PT Short Term Goals - 08/17/18 1237       PT SHORT TERM GOAL #1   Title  ind with initial HEP    Time  1    Period  Days    Status  Achieved      PT SHORT TERM GOAL #2   Title  ------------        PT Long Term Goals - 08/17/18 1237      PT LONG TERM GOAL #1   Title  -----------      PT LONG TERM GOAL #2   Title  -------------      PT LONG TERM GOAL #3   Title  -----------------      PT LONG TERM GOAL #4   Title  -----------------      PT LONG TERM GOAL #5   Title  -------------             Plan - 08/17/18 1231    Clinical Impression Statement  Patient has had bil hip pain for months and was seen at our [place] clinic for 5-6 weeks in the summer. She reports she has not been compliant with HEP. She had some relief, but pain continued intermittently. Left hip is worse than right. She has some tightness in bil HS and in her right low back and piriiformis muscles. She also demos some low back instability with MMT. PT reviewed patient's HEP from previous PT which addressed these issues and added two exercises. Patient plans to start HEP again and will return to PT if pain does not improve.     Stability/Clinical Decision Making  Stable/Uncomplicated    Clinical Decision Making  Low    Rehab Potential  Excellent    PT Frequency  Other (comment)    PT Treatment/Interventions  Cryotherapy;Electrical Stimulation;Iontophoresis 4mg /ml Dexamethasone;Ultrasound;Moist Heat;Therapeutic exercise;Dry needling;Manual techniques;Neuromuscular re-education    PT Next Visit Plan  Patient plans to start HEP again and will return to PT if pain does not improve. Hold at least 2 weeks.  PT Home Exercise Plan  [email or web address]     Consulted and Agree with Plan of Care  Patient       Patient will benefit from skilled therapeutic intervention in order to improve the following deficits and impairments:  Pain, Decreased strength, Impaired flexibility  Visit Diagnosis: Pain in left hip - Plan: PT plan of care cert/re-cert  Pain in  right hip - Plan: PT plan of care cert/re-cert     Problem List Patient Active Problem List   Diagnosis Date Noted  . Hypothyroidism 07/22/2017  . Menopausal hot flushes 07/22/2017  . Snoring 08/07/2015  . Rhinitis, chronic 08/07/2015  . Bereavement 05/07/2015  . Melanoma of skin (Morrison) 05/07/2015  . Abnormal ECG during exercise stress test 05/01/2015  . Hyperlipidemia 05/01/2015    Akeila Lana PT 08/17/2018, 12:40 PM  [place], [name], [address] Phone: [phone]   Fax:  [phone]  Name: Danni Shima MRN: [id number] Date of Birth: 03/23/1951

## 2018-09-02 DIAGNOSIS — H353121 Nonexudative age-related macular degeneration, left eye, early dry stage: Secondary | ICD-10-CM | POA: Diagnosis not present

## 2018-09-02 DIAGNOSIS — H524 Presbyopia: Secondary | ICD-10-CM | POA: Diagnosis not present

## 2018-09-02 DIAGNOSIS — H25813 Combined forms of age-related cataract, bilateral: Secondary | ICD-10-CM | POA: Diagnosis not present

## 2018-09-02 DIAGNOSIS — H52223 Regular astigmatism, bilateral: Secondary | ICD-10-CM | POA: Diagnosis not present

## 2018-09-02 DIAGNOSIS — H43813 Vitreous degeneration, bilateral: Secondary | ICD-10-CM | POA: Diagnosis not present

## 2018-09-02 DIAGNOSIS — H353111 Nonexudative age-related macular degeneration, right eye, early dry stage: Secondary | ICD-10-CM | POA: Diagnosis not present

## 2018-09-02 DIAGNOSIS — H5203 Hypermetropia, bilateral: Secondary | ICD-10-CM | POA: Diagnosis not present

## 2018-09-02 DIAGNOSIS — Z0100 Encounter for examination of eyes and vision without abnormal findings: Secondary | ICD-10-CM | POA: Diagnosis not present

## 2018-09-02 DIAGNOSIS — H04123 Dry eye syndrome of bilateral lacrimal glands: Secondary | ICD-10-CM | POA: Diagnosis not present

## 2018-09-08 ENCOUNTER — Ambulatory Visit: Payer: Medicare HMO | Admitting: Psychiatry

## 2018-09-24 ENCOUNTER — Other Ambulatory Visit: Payer: Self-pay | Admitting: Internal Medicine

## 2018-11-23 ENCOUNTER — Ambulatory Visit: Payer: Medicare HMO | Admitting: Family Medicine

## 2018-12-29 DIAGNOSIS — D2271 Melanocytic nevi of right lower limb, including hip: Secondary | ICD-10-CM | POA: Diagnosis not present

## 2018-12-29 DIAGNOSIS — D1801 Hemangioma of skin and subcutaneous tissue: Secondary | ICD-10-CM | POA: Diagnosis not present

## 2018-12-29 DIAGNOSIS — D2261 Melanocytic nevi of right upper limb, including shoulder: Secondary | ICD-10-CM | POA: Diagnosis not present

## 2018-12-29 DIAGNOSIS — D2262 Melanocytic nevi of left upper limb, including shoulder: Secondary | ICD-10-CM | POA: Diagnosis not present

## 2018-12-29 DIAGNOSIS — L821 Other seborrheic keratosis: Secondary | ICD-10-CM | POA: Diagnosis not present

## 2018-12-29 DIAGNOSIS — L814 Other melanin hyperpigmentation: Secondary | ICD-10-CM | POA: Diagnosis not present

## 2018-12-29 DIAGNOSIS — D2272 Melanocytic nevi of left lower limb, including hip: Secondary | ICD-10-CM | POA: Diagnosis not present

## 2018-12-29 DIAGNOSIS — L918 Other hypertrophic disorders of the skin: Secondary | ICD-10-CM | POA: Diagnosis not present

## 2018-12-29 DIAGNOSIS — L820 Inflamed seborrheic keratosis: Secondary | ICD-10-CM | POA: Diagnosis not present

## 2018-12-29 DIAGNOSIS — Z85820 Personal history of malignant melanoma of skin: Secondary | ICD-10-CM | POA: Diagnosis not present

## 2019-01-12 ENCOUNTER — Other Ambulatory Visit: Payer: Self-pay | Admitting: Gynecology

## 2019-01-12 NOTE — Telephone Encounter (Signed)
Annual exam scheduled on 02/01/19

## 2019-02-01 ENCOUNTER — Encounter: Payer: Medicare HMO | Admitting: Gynecology

## 2019-02-07 ENCOUNTER — Other Ambulatory Visit: Payer: Self-pay

## 2019-02-08 ENCOUNTER — Encounter: Payer: Medicare HMO | Admitting: Gynecology

## 2019-02-09 NOTE — Progress Notes (Signed)
Patient's appointment was rescheduled This encounter was created in error - please disregard.

## 2019-02-16 ENCOUNTER — Other Ambulatory Visit: Payer: Self-pay

## 2019-02-17 ENCOUNTER — Encounter: Payer: Self-pay | Admitting: Gynecology

## 2019-02-17 ENCOUNTER — Ambulatory Visit (INDEPENDENT_AMBULATORY_CARE_PROVIDER_SITE_OTHER): Payer: Medicare HMO | Admitting: Gynecology

## 2019-02-17 VITALS — BP 120/76 | Ht 63.5 in | Wt 135.0 lb

## 2019-02-17 DIAGNOSIS — N952 Postmenopausal atrophic vaginitis: Secondary | ICD-10-CM

## 2019-02-17 DIAGNOSIS — N951 Menopausal and female climacteric states: Secondary | ICD-10-CM | POA: Diagnosis not present

## 2019-02-17 DIAGNOSIS — Z9189 Other specified personal risk factors, not elsewhere classified: Secondary | ICD-10-CM

## 2019-02-17 DIAGNOSIS — Z79890 Hormone replacement therapy (postmenopausal): Secondary | ICD-10-CM

## 2019-02-17 DIAGNOSIS — Z01419 Encounter for gynecological examination (general) (routine) without abnormal findings: Secondary | ICD-10-CM

## 2019-02-17 MED ORDER — ESTRADIOL 0.0375 MG/24HR TD PTWK: MEDICATED_PATCH | TRANSDERMAL | 4 refills | Status: DC

## 2019-02-17 NOTE — Progress Notes (Signed)
    Olivia Morrison 03/26/1951 [id number]        68 y.o.  [id number] for breast and pelvic exam  Past medical history,surgical history, problem list, medications, allergies, family history and social history were all reviewed and documented as reviewed in the EPIC chart.  ROS:  Performed with pertinent positives and negatives included in the history, assessment and plan.   Additional significant findings : None   Exam: Caryn Bee assistant Vitals:   02/17/19 0929  BP: 120/76  Weight: 135 lb (61.2 kg)  Height: 5' 3.5" (1.613 m)   Body mass index is 23.54 kg/m.  General appearance:  Normal affect, orientation and appearance. Skin: Grossly normal HEENT: Without gross lesions.  No cervical or supraclavicular adenopathy. Thyroid normal.  Lungs:  Clear without wheezing, rales or rhonchi Cardiac: RR, without RMG Abdominal:  Soft, nontender, without masses, guarding, rebound, organomegaly or hernia Breasts:  Examined lying and sitting without masses, retractions, discharge or axillary adenopathy. Pelvic:  Ext, BUS, Vagina: With atrophic changes  Adnexa: Without masses or tenderness    Anus and perineum: Normal   Rectovaginal: Normal sphincter tone without palpated masses or tenderness.    Assessment/Plan:  68 y.o. [id number] female for breast and pelvic exam.  Status post hysterectomy  1. Postmenopausal/HRT.  Continues on estradiol 0.0375 patch.  Still having some hot flushes and sweats.  We discussed again the risks versus benefits of estrogen replacement to include increased risk of thrombosis such as stroke heart attack DVT as well as the breast cancer issue.  She does have a history of melanoma and discussed conflicting data.  Options to wean now versus continuing on her 0.0375 discussed.  Also discussed possibly going up to 0.05 given that she still having hot flushes also discussed.  After a lengthy discussion at this point she wants to continue and I refilled her a 0.0375 x 1 year.   She will decide whether she wants to try to wean this coming winter. 2. Mammography 2018.  I reminded patient that she is overdue.  Breast cancer the most common cancer in women.  Need to schedule stressed.  Breast exam normal today. 3. Pap smear 2018.  No Pap smear done today.  No history of significant abnormal Pap smears.  Options to stop screening per current screening guidelines based on hysterectomy and age reviewed versus less frequent screening intervals.  Will readdress on an annual basis. 4. Colonoscopy 2015.  Repeat at their recommended interval. 5. DEXA never.  Had planned to do last year but never followed up.  Recommended again she schedule baseline DEXA now and she agrees to do so. 6. Health maintenance.  No routine lab work done as patient does this elsewhere.  Follow-up 1 year, sooner as needed.   Anastasio Auerbach MD, 10:11 AM 02/17/2019

## 2019-02-25 DIAGNOSIS — H43392 Other vitreous opacities, left eye: Secondary | ICD-10-CM | POA: Diagnosis not present

## 2019-02-25 DIAGNOSIS — H35371 Puckering of macula, right eye: Secondary | ICD-10-CM | POA: Diagnosis not present

## 2019-03-09 ENCOUNTER — Other Ambulatory Visit: Payer: Self-pay

## 2019-03-10 ENCOUNTER — Ambulatory Visit: Payer: Medicare HMO | Admitting: Gynecology

## 2019-03-10 ENCOUNTER — Encounter: Payer: Self-pay | Admitting: Gynecology

## 2019-03-10 VITALS — BP 130/84

## 2019-03-10 DIAGNOSIS — N644 Mastodynia: Secondary | ICD-10-CM

## 2019-03-10 NOTE — Patient Instructions (Signed)
The breast center should call you to arrange for the mammogram and ultrasound.  If you do not hear from them within the next week or so call my office.

## 2019-03-10 NOTE — Progress Notes (Signed)
    Olivia Morrison 11/25/1950 [id number]        68 y.o.  [id number] presents with several days of right breast tenderness.  It is in the area where she had reported a breast biopsy several years ago.  She does not feel anything on self breast exams.  No nipple discharge.  Past medical history,surgical history, problem list, medications, allergies, family history and social history were all reviewed and documented in the EPIC chart.  Directed ROS with pertinent positives and negatives documented in the history of present illness/assessment and plan.  Exam: Caryn Bee assistant Vitals:   03/10/19 1502  BP: 130/84   General appearance:  Normal Both breasts examined lying and sitting without masses, retractions, discharge, adenopathy.  The area of the patient's pointing to is at 2 o'clock position 2 fingerbreadths off the areola.  I feel normal breast tissue overlying a rib in this region.  Assessment/Plan:  68 y.o. [id number] with new onset breast tenderness on the right.  No palpable abnormalities.  Recommend diagnostic mammography and ultrasound over this area.  Various scenarios reviewed to include suspicious findings versus normal breast tissue.  If normal breast tissue or benign findings then patient will follow and if the tenderness continues she knows to call my office and we will pursue a more involved evaluation.    Anastasio Auerbach MD, 3:15 PM 03/10/2019

## 2019-03-14 ENCOUNTER — Other Ambulatory Visit: Payer: Self-pay

## 2019-03-15 ENCOUNTER — Ambulatory Visit (INDEPENDENT_AMBULATORY_CARE_PROVIDER_SITE_OTHER): Payer: Medicare HMO

## 2019-03-15 ENCOUNTER — Other Ambulatory Visit: Payer: Self-pay | Admitting: Gynecology

## 2019-03-15 ENCOUNTER — Encounter: Payer: Self-pay | Admitting: Gynecology

## 2019-03-15 DIAGNOSIS — Z780 Asymptomatic menopausal state: Secondary | ICD-10-CM | POA: Diagnosis not present

## 2019-03-15 DIAGNOSIS — Z01419 Encounter for gynecological examination (general) (routine) without abnormal findings: Secondary | ICD-10-CM

## 2019-03-23 ENCOUNTER — Encounter: Payer: Self-pay | Admitting: Gynecology

## 2019-04-04 ENCOUNTER — Other Ambulatory Visit: Payer: Self-pay | Admitting: Internal Medicine

## 2019-04-04 ENCOUNTER — Telehealth: Payer: Self-pay | Admitting: Internal Medicine

## 2019-04-04 MED ORDER — ARMOUR THYROID 60 MG PO TABS: 60.0000 mg | ORAL_TABLET | Freq: Every day | ORAL | 3 refills | Status: DC

## 2019-04-04 NOTE — Telephone Encounter (Signed)
I sent Armour.

## 2019-04-04 NOTE — Telephone Encounter (Signed)
Patient ph# [phone] called re: Naturethroid is no longer available. Patient requests a RX for a replacement to be sent to:  [facility] [place], [address] AT [facility] OF [address] [phone] (Phone) [phone] (Fax)

## 2019-04-05 ENCOUNTER — Telehealth: Payer: Self-pay

## 2019-04-05 DIAGNOSIS — E039 Hypothyroidism, unspecified: Secondary | ICD-10-CM

## 2019-04-05 NOTE — Telephone Encounter (Signed)
Insurance does not cover Armour   Please advise if you would like to change RX or do PA.

## 2019-04-05 NOTE — Telephone Encounter (Signed)
Would she want to change to levothyroxine 100 mcg daily?  If so, we can send this for approximately 45 days with 2 refills and we need labs: TSH and free T4 (can you please order?) At the end of this interval.

## 2019-04-06 MED ORDER — LEVOTHYROXINE SODIUM 100 MCG PO TABS: 100.0000 ug | ORAL_TABLET | Freq: Every day | ORAL | 3 refills | Status: DC

## 2019-04-06 NOTE — Telephone Encounter (Signed)
RX sent and labs ordered. Patient notified via [place].

## 2019-04-08 ENCOUNTER — Telehealth: Payer: Self-pay

## 2019-04-08 MED ORDER — ARMOUR THYROID 60 MG PO TABS: 60.0000 mg | ORAL_TABLET | Freq: Every day | ORAL | 3 refills | Status: DC

## 2019-04-08 NOTE — Telephone Encounter (Signed)
Prior authorization for Armour Thyroid has been approved by patient's insurance.  Coverage is effective 06/16/2018 to 06/15/2020  Approval letter has been sent to scanning.

## 2019-04-26 ENCOUNTER — Other Ambulatory Visit: Payer: Self-pay

## 2019-04-26 ENCOUNTER — Ambulatory Visit: Payer: Medicare HMO | Admitting: Internal Medicine

## 2019-04-28 ENCOUNTER — Ambulatory Visit: Payer: Medicare HMO | Admitting: Internal Medicine

## 2019-05-02 ENCOUNTER — Ambulatory Visit: Payer: Medicare HMO | Admitting: Internal Medicine

## 2019-05-02 ENCOUNTER — Other Ambulatory Visit: Payer: Self-pay

## 2019-05-02 ENCOUNTER — Encounter: Payer: Self-pay | Admitting: Internal Medicine

## 2019-05-02 VITALS — BP 140/70 | HR 67 | Ht 63.5 in | Wt 142.0 lb

## 2019-05-02 DIAGNOSIS — E039 Hypothyroidism, unspecified: Secondary | ICD-10-CM

## 2019-05-02 LAB — TSH: TSH: 1.33 u[IU]/mL (ref 0.35–4.50)

## 2019-05-02 LAB — T3, FREE: T3, Free: 4.8 pg/mL — ABNORMAL HIGH (ref 2.3–4.2)

## 2019-05-02 LAB — T4, FREE: Free T4: 0.75 ng/dL (ref 0.60–1.60)

## 2019-05-02 NOTE — Patient Instructions (Signed)
Please continue Armour 60 mg daily.  Take the thyroid hormone every day, with water, at least 30 minutes before breakfast, separated by at least 4 hours from: - acid reflux medications - calcium - iron - multivitamins  Please stop at the lab.  Please come back for a follow-up appointment in 1 year.

## 2019-05-02 NOTE — Progress Notes (Signed)
Patient ID: Olivia Morrison, female   DOB: [birth year], 68 y.o.   MRN: [id number]    HPI  Olivia Morrison is a 68 y.o.-year-old female, returning for follow-up for hypothyroidism.  Last visit 1 year ago.Marland Kitchen  Before last visit she started HRT (Climara) and felt a little better, with improved hot flashes. These are worse after alcohol.  Reviewed and addended history: Patient has been seen by an integrative medicine doctor for the last 15 years. She has been on thyroid supplementation since 2016-2017, then on Nature-Throid 65 mg daily (1 gr).  It is not clear how the diagnosis of hypothyroidism was made but she does remember that this was mild.  At last visit, she was having hyperthyroid symptoms so we changed to levothyroxine 100 mcg daily.  As a TSH was slightly low afterwards, we decreased the dose of levothyroxine in 08/2017.  At last visit, she was on levothyroxine 88 mcg daily, but we switched back to Nature-Throid in 01/2018 (as she was gaining weight on levothyroxine).  However, Nature-Throid was on backorder so we switched her to Armour since last visit.  With prior authorization, Armour was approved until the end of next year.  Pt is on Armour 60 mg daily, taken: - in am - fasting - at least 30 min from b'fast - no Ca, Fe, PPIs - + Multivitamin 2h after Armour - not on Biotin  Reviewed her TFTs: Lab Results  Component Value Date   TSH 1.40 04/20/2018   TSH 0.65 10/20/2017   TSH 0.18 (L) 08/31/2017   TSH 2.41 07/22/2017   TSH 0.63 05/22/2015   FREET4 0.66 04/20/2018   FREET4 0.95 10/20/2017   FREET4 1.14 08/31/2017   FREET4 0.69 07/22/2017   FREET4 0.64 05/22/2015  2018: TSH reportedly normal  Pt denies: - feeling nodules in neck - hoarseness - dysphagia - choking - SOB with lying down  She has no FH of thyroid disorders. No FH of thyroid cancer. No h/o radiation tx to head or neck.  No Biotin use. No recent steroids use. She started a Thyroid support supplement with  iodine, selenium, B6, Zn.  She has a history of TAH in her 74s.  She has been on HRT since then.  In the past, she was also on testosterone cream, estrogen, and micronized progesterone. She tried to come off >> but more anxiety and hot flushes >> Effexor was started >> helped with hot flushes but has lucid dreams with her deceased parents >> now off. I suggested to stop testosterone and progesterone, and to taper estrogen to off >> she is now on HRT.  ROS: Constitutional: + weight gain/+ weight loss, + fatigue, no subjective hyperthermia, no subjective hypothermia Eyes: no blurry vision, no xerophthalmia ENT: no sore throat, + see HPI Cardiovascular: no CP/no SOB/no palpitations/no leg swelling Respiratory: no cough/no SOB/no wheezing Gastrointestinal: no N/no V/no D/no C/no acid reflux Musculoskeletal: no muscle aches/no joint aches Skin: no rashes, no hair loss Neurological: no tremors/no numbness/no tingling/no dizziness  I reviewed pt's medications, allergies, PMH, social hx, family hx, and changes were documented in the history of present illness. Otherwise, unchanged from my initial visit note.  Past Medical History:  Diagnosis Date  . Abnormal stress test    2016, saw cards, nuclear scan planned  . Depression    seeing [name] - psychologist  . GERD (gastroesophageal reflux disease)   . Melanoma ([place])    in 2016, removed; seeing Dr. [name]   Past Surgical History:  Procedure Laterality Date  . ABDOMINAL HYSTERECTOMY  1993   leiomyomata, bleeding  . BREAST BIOPSY Right 2015  . Eye lid surg    . Melanoma excised    . TONSILLECTOMY  1959   Social History   Socioeconomic History  . Marital status: Married    Spouse name: Not on file  . Number of children: 2  Social Needs  Occupational History  .  Retired  Tobacco Use  . Smoking status: Former Smoker    Packs/day: 0.75    Years: 5.00    Pack years: 3.75    Types: Cigarettes    Last attempt to quit: 04/16/2007     Years since quitting: 10.2  . Smokeless tobacco: Never Used  Substance and Sexual Activity  . Alcohol use: Yes    Alcohol/week: 0.0 oz    Comment: 1-2 drinks over the weekend, previously 1 drink nightly (martini)  . Drug use: No  . Sexual activity: Not on file  Other Topics Concern  . Not on file  Social History Narrative   Lives with husband in a 2 story home.  Has 2 sons.     Not currently working.  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    Education: some college.   Current Outpatient Medications on File Prior to Visit  Medication Sig Dispense Refill  . ARMOUR THYROID 60 MG tablet Take 1 tablet (60 mg total) by mouth daily before breakfast. 90 tablet 3  . Cholecalciferol (VITAMIN D3) 5000 units CAPS Take by mouth.    . estradiol (CLIMARA - DOSED IN MG/24 HR) 0.0375 mg/24hr patch APPLY 1 PATCH(0.038 MG) EXTERNALLY TO THE SKIN 1 TIME A WEEK 12 patch 4  . levothyroxine (SYNTHROID) 100 MCG tablet Take 1 tablet (100 mcg total) by mouth daily. 45 tablet 3  . Multiple Vitamins-Minerals (ZINC PO) Take by mouth.    . Nutritional Supplements (JUICE PLUS FIBRE PO) Take by mouth.    Marland Kitchen OVER THE COUNTER MEDICATION Berberine    . TURMERIC PO Take by mouth.    . zinc gluconate 50 MG tablet Take 50 mg by mouth daily.     No current facility-administered medications on file prior to visit.    Allergies  Allergen Reactions  . Tetanus Toxoids Other (See Comments)    Symptoms of lockjaw and flu per patient   Family History  Problem Relation Age of Onset  . Arthritis Mother   . Heart disease Mother   . Hypertension Mother   . Alcoholism Father   . Arthritis Father   . Heart disease Father   . Hypertension Father   . Arthritis Sister   . Mental illness Maternal Aunt   . Asthma Brother     PE: BP 140/70   Pulse 67   Ht 5' 3.5" (1.613 m)   Wt 142 lb (64.4 kg)   SpO2 98%   BMI 24.76 kg/m  Wt Readings from Last 3 Encounters:  05/02/19 142 lb (64.4 kg)  02/17/19 135 lb  (61.2 kg)  07/22/18 139 lb 12.8 oz (63.4 kg)   Constitutional: normal weight, in NAD Eyes: PERRLA, EOMI, no exophthalmos ENT: moist mucous membranes, no thyromegaly, no cervical lymphadenopathy Cardiovascular: RRR, No MRG Respiratory: CTA B Gastrointestinal: abdomen soft, NT, ND, BS+ Musculoskeletal: no deformities, strength intact in all 4 Skin: moist, warm, no rashes Neurological: no tremor with outstretched hands, DTR normal in all 4   ASSESSMENT: 1. Hypothyroidism -Unclear if this is a real diagnosis,  she was started on thyroid hormones to improve her fatigue  2. Hot flushes  PLAN:  1. Patient with several years of presumed hypothyroidism, now back on desiccated thyroid extract after she experienced weight gain with levothyroxine.  We initially switched her to levothyroxine as she was having anxiety, hot flashes, and palpitations.  She felt better after switching to levothyroxine, but then noticed weight gain and she wanted to go back to Nature-Throid.  However, she had to change to Armour as Nature-Throid was on back order.  She continues on Armour. - latest thyroid labs reviewed with pt >> normal: Lab Results  Component Value Date   TSH 1.40 04/20/2018   - she continues on Armour 60 mg daily - pt feels good on this dose.  Weight is overall stable since last visit. - we discussed about taking the thyroid hormone every day, with water, >30 minutes before breakfast, separated by >4 hours from acid reflux medications, calcium, iron, multivitamins. Pt. is taking it correctly. - will check thyroid tests today: TSH, free T3 and fT4 - If labs are abnormal, she will need to return for repeat TFTs in 1.5 months  2.  Hot flashes -Most likely secondary to menopause, now on HRT -Realize it did not help significantly.  Black cohosh did not help. Tried a SSRI and this worked great, but she would like to avoid these meds 2/2 poss. SEs. -At last visit and again today we discussed that the T3  part of desiccated thyroid extract may contribute to hot flashes but she opted to continue this as she had weight gain from levothyroxine. Also, her hot flushes are mostly during the night, when the Armour T3 dose starts wearing off - discussed about possibly increasing Climara dose before starting to decrease >> she discussed with Dr. Phineas Real, but the plan is to taper HRT down in the near future, not increase the dose  Component     Latest Ref Rng & Units 05/02/2019  TSH     0.35 - 4.50 uIU/mL 1.33  Triiodothyronine,Free,Serum     2.3 - 4.2 pg/mL 4.8 (H)  T4,Free(Direct)     0.60 - 1.60 ng/dL 0.75   TSH is normal but free T4 is slightly high.  I would suggest to continue the same dose of Armour.  Philemon Kingdom, MD PhD [facility]

## 2019-05-09 ENCOUNTER — Encounter: Payer: Self-pay | Admitting: Internal Medicine

## 2019-05-09 ENCOUNTER — Other Ambulatory Visit: Payer: Self-pay | Admitting: Internal Medicine

## 2019-05-09 MED ORDER — ARMOUR THYROID 90 MG PO TABS: 45.0000 mg | ORAL_TABLET | Freq: Every day | ORAL | 3 refills | Status: AC

## 2019-07-05 DIAGNOSIS — L814 Other melanin hyperpigmentation: Secondary | ICD-10-CM | POA: Diagnosis not present

## 2019-07-05 DIAGNOSIS — D2262 Melanocytic nevi of left upper limb, including shoulder: Secondary | ICD-10-CM | POA: Diagnosis not present

## 2019-07-05 DIAGNOSIS — D1801 Hemangioma of skin and subcutaneous tissue: Secondary | ICD-10-CM | POA: Diagnosis not present

## 2019-07-05 DIAGNOSIS — L820 Inflamed seborrheic keratosis: Secondary | ICD-10-CM | POA: Diagnosis not present

## 2019-07-05 DIAGNOSIS — L918 Other hypertrophic disorders of the skin: Secondary | ICD-10-CM | POA: Diagnosis not present

## 2019-07-05 DIAGNOSIS — D2271 Melanocytic nevi of right lower limb, including hip: Secondary | ICD-10-CM | POA: Diagnosis not present

## 2019-07-05 DIAGNOSIS — Z85820 Personal history of malignant melanoma of skin: Secondary | ICD-10-CM | POA: Diagnosis not present

## 2019-07-05 DIAGNOSIS — L821 Other seborrheic keratosis: Secondary | ICD-10-CM | POA: Diagnosis not present

## 2019-09-14 DIAGNOSIS — Z85820 Personal history of malignant melanoma of skin: Secondary | ICD-10-CM | POA: Diagnosis not present

## 2019-09-14 DIAGNOSIS — L570 Actinic keratosis: Secondary | ICD-10-CM | POA: Diagnosis not present

## 2019-09-14 DIAGNOSIS — L821 Other seborrheic keratosis: Secondary | ICD-10-CM | POA: Diagnosis not present

## 2019-09-14 DIAGNOSIS — B078 Other viral warts: Secondary | ICD-10-CM | POA: Diagnosis not present

## 2019-10-06 DIAGNOSIS — H5203 Hypermetropia, bilateral: Secondary | ICD-10-CM | POA: Diagnosis not present

## 2020-01-13 DIAGNOSIS — L821 Other seborrheic keratosis: Secondary | ICD-10-CM | POA: Diagnosis not present

## 2020-01-13 DIAGNOSIS — L918 Other hypertrophic disorders of the skin: Secondary | ICD-10-CM | POA: Diagnosis not present

## 2020-01-13 DIAGNOSIS — D2271 Melanocytic nevi of right lower limb, including hip: Secondary | ICD-10-CM | POA: Diagnosis not present

## 2020-01-13 DIAGNOSIS — D225 Melanocytic nevi of trunk: Secondary | ICD-10-CM | POA: Diagnosis not present

## 2020-01-13 DIAGNOSIS — L814 Other melanin hyperpigmentation: Secondary | ICD-10-CM | POA: Diagnosis not present

## 2020-01-13 DIAGNOSIS — D2261 Melanocytic nevi of right upper limb, including shoulder: Secondary | ICD-10-CM | POA: Diagnosis not present

## 2020-01-13 DIAGNOSIS — D2262 Melanocytic nevi of left upper limb, including shoulder: Secondary | ICD-10-CM | POA: Diagnosis not present

## 2020-01-13 DIAGNOSIS — L820 Inflamed seborrheic keratosis: Secondary | ICD-10-CM | POA: Diagnosis not present

## 2020-01-13 DIAGNOSIS — Z85820 Personal history of malignant melanoma of skin: Secondary | ICD-10-CM | POA: Diagnosis not present

## 2020-01-13 DIAGNOSIS — D2272 Melanocytic nevi of left lower limb, including hip: Secondary | ICD-10-CM | POA: Diagnosis not present

## 2020-03-28 ENCOUNTER — Other Ambulatory Visit: Payer: Self-pay

## 2020-03-28 MED ORDER — ESTRADIOL 0.0375 MG/24HR TD PTWK: MEDICATED_PATCH | TRANSDERMAL | 0 refills | Status: AC

## 2020-03-28 NOTE — Telephone Encounter (Signed)
Annual exam scheduled 05/18/20.

## 2020-05-01 ENCOUNTER — Ambulatory Visit: Payer: Medicare HMO | Admitting: Internal Medicine

## 2020-05-18 ENCOUNTER — Encounter: Payer: Medicare HMO | Admitting: Obstetrics and Gynecology

## 2020-06-21 DIAGNOSIS — Z124 Encounter for screening for malignant neoplasm of cervix: Secondary | ICD-10-CM | POA: Diagnosis not present

## 2020-06-21 DIAGNOSIS — N951 Menopausal and female climacteric states: Secondary | ICD-10-CM | POA: Diagnosis not present

## 2020-06-21 DIAGNOSIS — Z6824 Body mass index (BMI) 24.0-24.9, adult: Secondary | ICD-10-CM | POA: Diagnosis not present

## 2020-06-21 DIAGNOSIS — N958 Other specified menopausal and perimenopausal disorders: Secondary | ICD-10-CM | POA: Diagnosis not present

## 2020-06-21 DIAGNOSIS — Z1231 Encounter for screening mammogram for malignant neoplasm of breast: Secondary | ICD-10-CM | POA: Diagnosis not present

## 2020-06-21 DIAGNOSIS — N760 Acute vaginitis: Secondary | ICD-10-CM | POA: Diagnosis not present

## 2020-06-25 DIAGNOSIS — Z124 Encounter for screening for malignant neoplasm of cervix: Secondary | ICD-10-CM | POA: Diagnosis not present

## 2020-07-19 DIAGNOSIS — L700 Acne vulgaris: Secondary | ICD-10-CM | POA: Diagnosis not present

## 2020-07-19 DIAGNOSIS — L814 Other melanin hyperpigmentation: Secondary | ICD-10-CM | POA: Diagnosis not present

## 2020-07-19 DIAGNOSIS — D692 Other nonthrombocytopenic purpura: Secondary | ICD-10-CM | POA: Diagnosis not present

## 2020-07-19 DIAGNOSIS — D2261 Melanocytic nevi of right upper limb, including shoulder: Secondary | ICD-10-CM | POA: Diagnosis not present

## 2020-07-19 DIAGNOSIS — Z85820 Personal history of malignant melanoma of skin: Secondary | ICD-10-CM | POA: Diagnosis not present

## 2020-07-19 DIAGNOSIS — D2272 Melanocytic nevi of left lower limb, including hip: Secondary | ICD-10-CM | POA: Diagnosis not present

## 2020-07-19 DIAGNOSIS — D2271 Melanocytic nevi of right lower limb, including hip: Secondary | ICD-10-CM | POA: Diagnosis not present

## 2020-07-19 DIAGNOSIS — D1801 Hemangioma of skin and subcutaneous tissue: Secondary | ICD-10-CM | POA: Diagnosis not present

## 2020-07-19 DIAGNOSIS — L821 Other seborrheic keratosis: Secondary | ICD-10-CM | POA: Diagnosis not present

## 2020-08-10 ENCOUNTER — Ambulatory Visit: Payer: Medicare HMO | Admitting: Psychiatry

## 2020-08-14 ENCOUNTER — Encounter: Payer: Self-pay | Admitting: Psychiatry

## 2020-08-14 ENCOUNTER — Other Ambulatory Visit: Payer: Self-pay

## 2020-08-14 ENCOUNTER — Ambulatory Visit (INDEPENDENT_AMBULATORY_CARE_PROVIDER_SITE_OTHER): Payer: Self-pay | Admitting: Psychiatry

## 2020-08-14 DIAGNOSIS — F4323 Adjustment disorder with mixed anxiety and depressed mood: Secondary | ICD-10-CM

## 2020-08-14 NOTE — Progress Notes (Signed)
      [facility] Counselor/Therapist Progress Note  Patient ID: Olivia Morrison, MRN: [id number],    Date: 08/14/2020  Time Spent: 50 minutes   Treatment Type: Individual Therapy  Reported Symptoms: anxious, sad  Mental Status Exam:  Appearance:   Well Groomed     Behavior:  Appropriate  Motor:  Normal  Speech/Language:   Clear and Coherent  Affect:  Appropriate  Mood:  anxious and sad  Thought process:  normal  Thought content:    WNL  Sensory/Perceptual disturbances:    WNL  Orientation:  oriented to person, place, time/date and situation  Attention:  Good  Concentration:  Good  Memory:  WNL  Fund of knowledge:   Good  Insight:    Good  Judgment:   Good  Impulse Control:  Good   Risk Assessment: Danger to Self:  No Self-injurious Behavior: No Danger to Others: No Duty to Warn:no Physical Aggression / Violence:No  Access to Firearms a concern: No  Gang Involvement:No   Subjective: "I need to deal with some emotional issues."  The client states that February has historically been difficult for her.  Many emotionally difficult events occurred during that month.  The main event was her ECT treatments in 1998.  She feels sad and anxious.  "I feel disconnected from my husband."  She states her husband does not seem to be able to connect with her emotionally not out of malice but self absorption.  "How can I accept where I am?"  The client states that she works hard at being grateful for what she has but the "do not have's" takes over in her head.  She states she feels very lonely and prays  for a release from that.  She is also having thyroid issues and is scheduled to follow-up with an integrative physician next week. When she tries to communicate with her husband about what she needs he turns it around that, "it is always me."  The client felt broken..  Today we used the bilateral stimulation hand paddles focusing on the insecurity, the sadness, and unworthiness with the client  feels.  Her subjective units of distress is a 7.  She feels it in her chest.  As the client processed I had her visualize a safe place and bringing in [place] as a resource.  She was able to let all of this go to him and felt much more peaceful and comforting.  "I feel his tenderness."  Her subjective units of distress was less than 2 at the end of the session.  The client and her husband are going to begin to attend a new church.  I encouraged the client to connect with the women there through the different activities.  She agreed.  Interventions: Assertiveness/Communication, Motivational Interviewing, Solution-Oriented/Positive Psychology, [employer] Desensitization and Reprocessing (EMDR) and Insight-Oriented  Diagnosis:   ICD-10-CM   1. Adjustment disorder with mixed anxiety and depressed mood  F43.23     Plan: Mood independent behavior, engaged new church, engagement in the women's activities there, positive self talk, self-care, assertiveness, boundaries increased social network.Albertina Parr Samad Thon, [facility]

## 2020-08-21 DIAGNOSIS — D649 Anemia, unspecified: Secondary | ICD-10-CM | POA: Diagnosis not present

## 2020-08-21 DIAGNOSIS — E2839 Other primary ovarian failure: Secondary | ICD-10-CM | POA: Diagnosis not present

## 2020-08-21 DIAGNOSIS — E539 Vitamin B deficiency, unspecified: Secondary | ICD-10-CM | POA: Diagnosis not present

## 2020-08-21 DIAGNOSIS — E59 Dietary selenium deficiency: Secondary | ICD-10-CM | POA: Diagnosis not present

## 2020-08-21 DIAGNOSIS — E237 Disorder of pituitary gland, unspecified: Secondary | ICD-10-CM | POA: Diagnosis not present

## 2020-08-21 DIAGNOSIS — E729 Disorder of amino-acid metabolism, unspecified: Secondary | ICD-10-CM | POA: Diagnosis not present

## 2020-08-21 DIAGNOSIS — Z131 Encounter for screening for diabetes mellitus: Secondary | ICD-10-CM | POA: Diagnosis not present

## 2020-08-21 DIAGNOSIS — E279 Disorder of adrenal gland, unspecified: Secondary | ICD-10-CM | POA: Diagnosis not present

## 2020-08-21 DIAGNOSIS — E038 Other specified hypothyroidism: Secondary | ICD-10-CM | POA: Diagnosis not present

## 2020-08-23 DIAGNOSIS — L814 Other melanin hyperpigmentation: Secondary | ICD-10-CM | POA: Diagnosis not present

## 2020-08-23 DIAGNOSIS — L570 Actinic keratosis: Secondary | ICD-10-CM | POA: Diagnosis not present

## 2020-08-23 DIAGNOSIS — Z85820 Personal history of malignant melanoma of skin: Secondary | ICD-10-CM | POA: Diagnosis not present

## 2020-09-06 ENCOUNTER — Encounter (HOSPITAL_BASED_OUTPATIENT_CLINIC_OR_DEPARTMENT_OTHER): Payer: Self-pay | Admitting: Emergency Medicine

## 2020-09-06 ENCOUNTER — Emergency Department (HOSPITAL_BASED_OUTPATIENT_CLINIC_OR_DEPARTMENT_OTHER)
Admission: EM | Admit: 2020-09-06 | Discharge: 2020-09-06 | Disposition: A | Discharge status: Home / Self Care | Payer: Medicare HMO | Attending: Emergency Medicine | Admitting: Emergency Medicine

## 2020-09-06 ENCOUNTER — Other Ambulatory Visit: Payer: Self-pay

## 2020-09-06 ENCOUNTER — Emergency Department (HOSPITAL_BASED_OUTPATIENT_CLINIC_OR_DEPARTMENT_OTHER): Payer: Medicare HMO | Admitting: Radiology

## 2020-09-06 DIAGNOSIS — R072 Precordial pain: Secondary | ICD-10-CM | POA: Insufficient documentation

## 2020-09-06 DIAGNOSIS — R0789 Other chest pain: Secondary | ICD-10-CM | POA: Diagnosis not present

## 2020-09-06 DIAGNOSIS — E039 Hypothyroidism, unspecified: Secondary | ICD-10-CM | POA: Diagnosis not present

## 2020-09-06 DIAGNOSIS — Z87891 Personal history of nicotine dependence: Secondary | ICD-10-CM | POA: Insufficient documentation

## 2020-09-06 DIAGNOSIS — R079 Chest pain, unspecified: Secondary | ICD-10-CM

## 2020-09-06 DIAGNOSIS — K219 Gastro-esophageal reflux disease without esophagitis: Secondary | ICD-10-CM | POA: Diagnosis not present

## 2020-09-06 LAB — CBC WITH DIFFERENTIAL/PLATELET
Abs Immature Granulocytes: 0.04 10*3/uL (ref 0.00–0.07)
Basophils Absolute: 0.1 10*3/uL (ref 0.0–0.1)
Basophils Relative: 1 %
Eosinophils Absolute: 0.1 10*3/uL (ref 0.0–0.5)
Eosinophils Relative: 1 %
HCT: 39.5 % (ref 36.0–46.0)
Hemoglobin: 13 g/dL (ref 12.0–15.0)
Immature Granulocytes: 0 %
Lymphocytes Relative: 22 %
Lymphs Abs: 2.8 10*3/uL (ref 0.7–4.0)
MCH: 30.3 pg (ref 26.0–34.0)
MCHC: 32.9 g/dL (ref 30.0–36.0)
MCV: 92.1 fL (ref 80.0–100.0)
Monocytes Absolute: 1.1 10*3/uL — ABNORMAL HIGH (ref 0.1–1.0)
Monocytes Relative: 9 %
Neutro Abs: 8.6 10*3/uL — ABNORMAL HIGH (ref 1.7–7.7)
Neutrophils Relative %: 67 %
Platelets: 315 10*3/uL (ref 150–400)
RBC: 4.29 MIL/uL (ref 3.87–5.11)
RDW: 13.5 % (ref 11.5–15.5)
WBC: 12.7 10*3/uL — ABNORMAL HIGH (ref 4.0–10.5)
nRBC: 0 % (ref 0.0–0.2)

## 2020-09-06 LAB — COMPREHENSIVE METABOLIC PANEL
ALT: 25 U/L (ref 0–44)
AST: 29 U/L (ref 15–41)
Albumin: 4.8 g/dL (ref 3.5–5.0)
Alkaline Phosphatase: 62 U/L (ref 38–126)
Anion gap: 9 (ref 5–15)
BUN: 22 mg/dL (ref 8–23)
CO2: 27 mmol/L (ref 22–32)
Calcium: 9.9 mg/dL (ref 8.9–10.3)
Chloride: 101 mmol/L (ref 98–111)
Creatinine, Ser: 0.73 mg/dL (ref 0.44–1.00)
GFR, Estimated: >60 mL/min (ref >60)
Glucose, Bld: 102 mg/dL — ABNORMAL HIGH (ref 70–99)
Potassium: 4 mmol/L (ref 3.5–5.1)
Sodium: 137 mmol/L (ref 135–145)
Total Bilirubin: 0.4 mg/dL (ref 0.3–1.2)
Total Protein: 7.2 g/dL (ref 6.5–8.1)

## 2020-09-06 LAB — TROPONIN I (HIGH SENSITIVITY)
Troponin I (High Sensitivity): <2 ng/L (ref <18)
Troponin I (High Sensitivity): 5 ng/L (ref <18)

## 2020-09-06 LAB — D-DIMER, QUANTITATIVE: D-Dimer, Quant: <0.27 ug/mL-FEU (ref 0.00–0.50)

## 2020-09-06 LAB — LIPASE, BLOOD: Lipase: 36 U/L (ref 11–51)

## 2020-09-06 NOTE — ED Notes (Signed)
Per lab, pts troponin is in process, slow to result d/t small sample. EDP [place] notified

## 2020-09-06 NOTE — ED Notes (Signed)
Patient transported to X-ray 

## 2020-09-06 NOTE — ED Provider Notes (Signed)
Received patient care in signout from Dr. Ron Parker at 1500.  Please refer to previous note for full HPI.  Briefly this is a 70 year old female who presented with intermittent chest pressure.  Vitals are stable on arrival.  EKG has nonspecific changes.  Blood work is reassuring, we are pending troponin and D-dimer. Physical Exam  BP (!) 155/79   Pulse 72   Temp 98.6 F (37 C) (Oral)   Resp 15   Ht 5\' 4"  (1.626 m)   Wt 64.4 kg   SpO2 100%   BMI 24.37 kg/m   Physical Exam Vitals and nursing note reviewed.  Constitutional:      Appearance: Normal appearance.  HENT:     Head: Normocephalic.     Mouth/Throat:     Mouth: Mucous membranes are moist.  Cardiovascular:     Rate and Rhythm: Normal rate.  Pulmonary:     Effort: Pulmonary effort is normal. No respiratory distress.  Abdominal:     Palpations: Abdomen is soft.     Tenderness: There is no abdominal tenderness.  Skin:    General: Skin is warm.  Neurological:     Mental Status: She is alert and oriented to person, place, and time. Mental status is at baseline.  Psychiatric:        Mood and Affect: Mood normal.     ED Course/Procedures     Procedures  MDM  D-dimer was negative.  Troponin is negative.  Of note there was a significant delay in this patient's care due to the lab machine breaking down here at [facility] and having to send the labs to [facility].  Patient and her husband were very pleasant, I have low suspicion for ACS but did discuss with them following up with a cardiologist for further evaluation.  Patient will be discharged and treated as an outpatient.  Discharge plan and strict return to ED precautions discussed, patient verbalizes understanding and agreement.       Lorelle Gibbs, DO 09/06/20 2249

## 2020-09-06 NOTE — ED Triage Notes (Signed)
Pt arrives pov, reports being sent by pcp for check for abnormal ekg after followup for lab work.. Pt denies CP, endorses "feeling like I need to take deep breath". Pt ambulatory to triage

## 2020-09-06 NOTE — ED Provider Notes (Signed)
[facility] EMERGENCY DEPT Provider Note   CSN: [id number] Arrival date & time: 09/06/20  1420     History Chief Complaint  Patient presents with  . Abnormal Labs    Olivia Morrison is a 70 y.o. female.   Chest Pain Pain location:  Substernal area Pain quality: pressure   Pain radiates to:  Does not radiate Pain severity:  Moderate Onset quality:  Gradual Timing:  Intermittent Progression:  Waxing and waning Chronicity:  New Context: at rest   Relieved by:  Nothing Worsened by:  Nothing Ineffective treatments:  None tried Associated symptoms: no back pain, no cough, no fatigue, no fever, no headache, no nausea, no palpitations, no shortness of breath, no syncope and no vomiting   Risk factors: birth control (estrogen patch)   Risk factors: no coronary artery disease, no diabetes mellitus, no high cholesterol, no hypertension, not obese, no prior DVT/PE and no smoking     HPI: A 71 year old patient presents for evaluation of chest pain. Initial onset of pain was more than 6 hours ago. The patient's chest pain is described as heaviness/pressure/tightness and is not worse with exertion. The patient's chest pain is middle- or left-sided, is not well-localized, is not sharp and does not radiate to the arms/jaw/neck. The patient does not complain of nausea and denies diaphoresis. The patient has no history of stroke, has no history of peripheral artery disease, has not smoked in the past 90 days, denies any history of treated diabetes, has no relevant family history of coronary artery disease (first degree relative at less than age 52), is not hypertensive, has no history of hypercholesterolemia and does not have an elevated BMI (>=30).   Past Medical History:  Diagnosis Date  . Abnormal stress test    2016, saw cards, nuclear scan planned  . Depression    seeing [name] - psychologist  . GERD (gastroesophageal reflux disease)   . Melanoma ([place])    in 2016,  removed; seeing Dr. [name]    Patient Active Problem List   Diagnosis Date Noted  . Hypothyroidism 07/22/2017  . Menopausal hot flushes 07/22/2017  . Snoring 08/07/2015  . Rhinitis, chronic 08/07/2015  . Bereavement 05/07/2015  . Melanoma of skin ([place]) 05/07/2015  . Abnormal ECG during exercise stress test 05/01/2015  . Hyperlipidemia 05/01/2015    Past Surgical History:  Procedure Laterality Date  . ABDOMINAL HYSTERECTOMY  1993   leiomyomata, bleeding  . BREAST BIOPSY Right 2015  . Eye lid surg    . Melanoma excised    . TONSILLECTOMY  1959     OB History    Gravida  4   Para  3   Term      Preterm      AB  1   Living  2     SAB      IAB  1   Ectopic      Multiple      Live Births  2           Family History  Problem Relation Age of Onset  . Arthritis Mother   . Heart disease Mother   . Hypertension Mother   . Alcoholism Father   . Arthritis Father   . Heart disease Father   . Hypertension Father   . Arthritis Sister   . Mental illness Maternal Aunt   . Asthma Brother     Social History   Tobacco Use  . Smoking status: Former Smoker  Packs/day: 0.75    Years: 5.00    Pack years: 3.75    Types: Cigarettes    Quit date: 04/16/2007    Years since quitting: 13.4  . Smokeless tobacco: Never Used  Vaping Use  . Vaping Use: Never used  Substance Use Topics  . Alcohol use: Yes    Alcohol/week: 0.0 standard drinks    Comment: 1-2 drinks over the weekend, previously 1 drink nightly (martini)  . Drug use: No    Home Medications Prior to Admission medications   Medication Sig Start Date End Date Taking? Authorizing Provider  ARMOUR THYROID 90 MG tablet Take 0.5 tablets (45 mg total) by mouth daily. 05/09/19   Philemon Kingdom, MD  Cholecalciferol (VITAMIN D3) 5000 units CAPS Take by mouth.    [provider]  estradiol (CLIMARA - DOSED IN MG/24 HR) 0.0375 mg/24hr patch APPLY 1 PATCH(0.038 MG) EXTERNALLY TO THE SKIN 1 TIME A  WEEK 03/28/20   Joseph Pierini, MD  Multiple Vitamins-Minerals (ZINC PO) Take by mouth.    [provider]  Nutritional Supplements (JUICE PLUS FIBRE PO) Take by mouth.    [provider]  OVER THE COUNTER MEDICATION Berberine    [provider]  TURMERIC PO Take by mouth.    [provider]  zinc gluconate 50 MG tablet Take 50 mg by mouth daily.    [provider]    Allergies    Tetanus toxoids  Review of Systems   Review of Systems  Constitutional: Negative for chills, fatigue and fever.  HENT: Negative for congestion and rhinorrhea.   Respiratory: Negative for cough and shortness of breath.   Cardiovascular: Positive for chest pain. Negative for palpitations and syncope.  Gastrointestinal: Negative for diarrhea, nausea and vomiting.  Genitourinary: Negative for difficulty urinating and dysuria.  Musculoskeletal: Negative for arthralgias and back pain.  Skin: Negative for rash and wound.  Neurological: Negative for light-headedness and headaches.    Physical Exam Updated Vital Signs BP (!) 176/87   Pulse 75   Temp 98.6 F (37 C) (Oral)   Resp 15   Ht 5\' 4"  (1.626 m)   Wt 64.4 kg   SpO2 100%   BMI 24.37 kg/m   Physical Exam Vitals and nursing note reviewed. Exam conducted with a chaperone present.  Constitutional:      General: She is not in acute distress.    Appearance: Normal appearance.  HENT:     Head: Normocephalic and atraumatic.     Nose: No rhinorrhea.  Eyes:     General:        Right eye: No discharge.        Left eye: No discharge.     Conjunctiva/sclera: Conjunctivae normal.  Cardiovascular:     Rate and Rhythm: Normal rate and regular rhythm.     Heart sounds: No murmur heard. No friction rub. No gallop.   Pulmonary:     Effort: Pulmonary effort is normal. No respiratory distress.     Breath sounds: No stridor. No rales.  Abdominal:     General: Abdomen is flat. There is no distension.      Palpations: Abdomen is soft.     Tenderness: There is no abdominal tenderness. There is no guarding.  Musculoskeletal:        General: No tenderness or signs of injury.     Right lower leg: No edema.     Left lower leg: No edema.  Skin:    General: Skin  is warm and dry.     Capillary Refill: Capillary refill takes less than 2 seconds.  Neurological:     General: No focal deficit present.     Mental Status: She is alert. Mental status is at baseline.     Motor: No weakness.  Psychiatric:        Mood and Affect: Mood normal.        Behavior: Behavior normal.     ED Results / Procedures / Treatments   Labs (all labs ordered are listed, but only abnormal results are displayed) Labs Reviewed  CBC WITH DIFFERENTIAL/PLATELET  COMPREHENSIVE METABOLIC PANEL  LIPASE, BLOOD  D-DIMER, QUANTITATIVE  TROPONIN I (HIGH SENSITIVITY)    EKG None  Radiology No results found.  Procedures Procedures   Medications Ordered in ED Medications - No data to display  ED Course  I have reviewed the triage vital signs and the nursing notes.  Pertinent labs & imaging results that were available during my care of the patient were reviewed by me and considered in my medical decision making (see chart for details).    MDM Rules/Calculators/A&P HEAR Score: 86                        70 year old female comes in with chest pain.  Intermittently over the last several weeks.  Nonexertional.  No other symptoms.  No risk factors other than age.  Also will be screened for blood clot with D-dimer.  Risk factors are age and estrogen therapy.  EKG shows sinus rhythm with nonspecific T wave inversions in the inferior and lateral leads.  Sent from urgent care for evaluation laboratory studies.  Hear score is 4.  We will also get chest x-ray and chemistry and CBC.  Pt care was handed off to on coming provider at 1500.  Complete history and physical and current plan have been communicated.  Please refer to their  note for the remainder of ED care and ultimate disposition.  Pt seen in conjunction with Dr. Dina Rich  Final Clinical Impression(s) / ED Diagnoses Final diagnoses:  Chest pain, unspecified type    Rx / DC Orders ED Discharge Orders    None       Breck Coons, MD 09/06/20 1444

## 2020-09-06 NOTE — ED Notes (Signed)
Pt ambulatory to room, no s/s of distress

## 2020-09-06 NOTE — Discharge Instructions (Addendum)
You have been seen and discharged from the emergency department.  You were evaluated for chest pain.  Your EKG showed nonspecific changes, your cardiac work-up including troponin was normal however you still need a further in-depth cardiac evaluation by a cardiologist.  Follow-up for reevaluation and further care. Take home medications as prescribed. If you have any worsening symptoms or further concerns for health please return to an emergency department for further evaluation.

## 2020-09-28 DIAGNOSIS — J189 Pneumonia, unspecified organism: Secondary | ICD-10-CM | POA: Diagnosis not present

## 2020-09-28 DIAGNOSIS — R002 Palpitations: Secondary | ICD-10-CM | POA: Diagnosis not present

## 2020-09-28 DIAGNOSIS — R0602 Shortness of breath: Secondary | ICD-10-CM | POA: Diagnosis not present

## 2020-10-10 DIAGNOSIS — Z0000 Encounter for general adult medical examination without abnormal findings: Secondary | ICD-10-CM | POA: Diagnosis not present

## 2020-10-11 ENCOUNTER — Other Ambulatory Visit: Payer: Self-pay

## 2020-10-11 ENCOUNTER — Encounter: Payer: Self-pay | Admitting: Psychiatry

## 2020-10-11 ENCOUNTER — Ambulatory Visit (INDEPENDENT_AMBULATORY_CARE_PROVIDER_SITE_OTHER): Payer: Self-pay | Admitting: Psychiatry

## 2020-10-11 DIAGNOSIS — F4323 Adjustment disorder with mixed anxiety and depressed mood: Secondary | ICD-10-CM

## 2020-10-11 NOTE — Progress Notes (Signed)
      [facility] Counselor/Therapist Progress Note  Patient ID: Olivia Morrison, MRN: [id number],    Date: 10/11/2020  Time Spent: 45 minutes   Treatment Type: Individual Therapy  Reported Symptoms: sad, anxious  Mental Status Exam:  Appearance:   Well Groomed     Behavior:  Appropriate  Motor:  Normal  Speech/Language:   Clear and Coherent  Affect:  Appropriate  Mood:  anxious and sad  Thought process:  normal  Thought content:    WNL  Sensory/Perceptual disturbances:    WNL  Orientation:  oriented to person, place, time/date and situation  Attention:  Good  Concentration:  Good  Memory:  WNL  Fund of knowledge:   Good  Insight:    Good  Judgment:   Good  Impulse Control:  Good   Risk Assessment: Danger to Self:  No Self-injurious Behavior: No Danger to Others: No Duty to Warn:no Physical Aggression / Violence:No  Access to Firearms a concern: No  Gang Involvement:No   Subjective: The client comes in for her last appointment since I am retiring.  She discussed her anxiety and sadness with her sons and extended family.  She realizes that her husband will never really connect with her the way that she would like.  She knows that her daughters in law will not have the connection that she wants.  She realizes that she is too much of a people pleaser and as a result is chronically disappointed with her family's response.  We discussed the necessity to practice more mood independent behavior and to love people without the expectation of a response.  She agrees and will continue to work on this.  We also discussed mindful prayer.  The client finds that her spiritual life is a good anchor for her in general. We discussed that the client could follow up with Shanon Brow plank, LCSW as needed going forward.  She agreed.  Interventions: Motivational Interviewing, Solution-Oriented/Positive Psychology and Insight-Oriented  Diagnosis:   ICD-10-CM   1. Adjustment disorder with mixed  anxiety and depressed mood  F43.23     Plan: Mood independent behavior, self-care, positive self talk, mindful prayer, love people without the expectation of a response, radical acceptance.  Olivia Morrison, [facility]

## 2020-10-12 DIAGNOSIS — R739 Hyperglycemia, unspecified: Secondary | ICD-10-CM | POA: Diagnosis not present

## 2020-10-12 DIAGNOSIS — E039 Hypothyroidism, unspecified: Secondary | ICD-10-CM | POA: Diagnosis not present

## 2020-10-12 DIAGNOSIS — Z1211 Encounter for screening for malignant neoplasm of colon: Secondary | ICD-10-CM | POA: Diagnosis not present

## 2020-10-12 DIAGNOSIS — Z0000 Encounter for general adult medical examination without abnormal findings: Secondary | ICD-10-CM | POA: Diagnosis not present

## 2020-10-28 NOTE — Progress Notes (Signed)
Cardiology Office Note:    Date:  10/29/2020   ID:  Olivia Morrison, DOB May 10, 1951, MRN [id number]  PCP:  Fanny Bien, MD  Cardiologist:  None   Referring MD: No ref. provider found   Chief Complaint  Patient presents with  . Hypertension  . Hyperlipidemia  . Follow-up    Abnormal EKG  . Chest Pain    History of Present Illness:    Olivia Morrison is a 70 y.o. female with a hx of chest pain 2016 being seen for re-evaluation after [facility] visit 09/06/2020.  Emergency room visit in March, precipitated by concern over an EKG that was done at the time of appointment with Dr. Ace Gins at [facility].  She denies angina.  She denies shortness of breath.  She has 5 grandchildren under 24 years of age and keeps them several days a week.  She is very active with them without difficulty.  She has not been having palpitations, syncope, orthopnea, or PND.  She does occasionally awaken from sleep short of breath with chest tightness.  Previously tested for sleep apnea but told it is not bad enough to treat.  That evaluation was greater than 7 to 8 years ago.  She does snore loudly and she feels tired despite sleeping well at night.  Past Medical History:  Diagnosis Date  . Abnormal stress test    2016, saw cards, nuclear scan planned  . Depression    seeing [name] - psychologist  . GERD (gastroesophageal reflux disease)   . Melanoma ([place])    in 2016, removed; seeing Dr. [name]    Past Surgical History:  Procedure Laterality Date  . ABDOMINAL HYSTERECTOMY  1993   leiomyomata, bleeding  . BREAST BIOPSY Right 2015  . Eye lid surg    . Melanoma excised    . TONSILLECTOMY  1959    Current Medications: Current Meds  Medication Sig  . ARMOUR THYROID 90 MG tablet Take 0.5 tablets (45 mg total) by mouth daily.  . Ascorbic Acid (VITAMIN C PO) Take 2,500 mg by mouth 2 (two) times daily.  . Cholecalciferol (VITAMIN D3) 5000 units CAPS Take by mouth.  . estradiol  (CLIMARA - DOSED IN MG/24 HR) 0.0375 mg/24hr patch APPLY 1 PATCH(0.038 MG) EXTERNALLY TO THE SKIN 1 TIME A WEEK  . Magnesium 200 MG TABS Take 200 mg by mouth daily.  . Nutritional Supplements (JUICE PLUS FIBRE PO) Take by mouth.  . zinc gluconate 50 MG tablet Take 50 mg by mouth daily.     Allergies:   Tetanus toxoids   Social History   Socioeconomic History  . Marital status: Married    Spouse name: Not on file  . Number of children: Not on file  . Years of education: Not on file  . Highest education level: Not on file  Occupational History  . Not on file  Tobacco Use  . Smoking status: Former Smoker    Packs/day: 0.75    Years: 5.00    Pack years: 3.75    Types: Cigarettes    Quit date: 04/16/2007    Years since quitting: 13.5  . Smokeless tobacco: Never Used  Vaping Use  . Vaping Use: Never used  Substance and Sexual Activity  . Alcohol use: Yes    Alcohol/week: 0.0 standard drinks    Comment: 1-2 drinks over the weekend, previously 1 drink nightly (martini)  . Drug use: No  . Sexual activity: Yes    Birth control/protection: Post-menopausal  Comment: 1st intercourse 70 yo-More than 5 partners  Other Topics Concern  . Not on file  Social History Narrative   Lives with husband in a 2 story home.  Has 2 sons.     Not currently working.  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    Education: some college.   Social Determinants of Health   Financial Resource Strain: Not on file  Food Insecurity: Not on file  Transportation Needs: Not on file  Physical Activity: Not on file  Stress: Not on file  Social Connections: Not on file     Family History: The patient's family history includes Alcoholism in her father; Arthritis in her father, mother, and sister; Asthma in her brother; Heart disease in her father and mother; Hypertension in her father and mother; Mental illness in her maternal aunt.  ROS:   Please see the history of present illness.    She  wants to be preventive.  Prefers not to be on medications.  She has a naturopathic and a regular medical doctor.  All other systems reviewed and are negative.  EKGs/Labs/Other Studies Reviewed:    The following studies were reviewed today:  Recent laboratory data from April 2022:  LDL cholesterol 103  Total cholesterol 217  HDL cholesterol 88  Triglyceride 154  Hemoglobin A1c 5.8  NUCLEAR STRESS 2016: Study Highlights   The left ventricular ejection fraction is hyperdynamic (>65%).  Nuclear stress EF: 71%.  There was no ST segment deviation noted during stress.  The study is normal.  This is a low risk study.   1. Low risk study\. Nl Perfusion and EF   EKG:  EKG performed September 20, 2020 reveals normal sinus rhythm with nonspecific ST abnormality.  Recent Labs: 09/06/2020: ALT 25; BUN 22; Creatinine, Ser 0.73; Hemoglobin 13.0; Platelets 315; Potassium 4.0; Sodium 137  Recent Lipid Panel    Component Value Date/Time   CHOL 177 07/22/2018 1000   TRIG 79.0 07/22/2018 1000   HDL 53.80 07/22/2018 1000   CHOLHDL 3 07/22/2018 1000   VLDL 15.8 07/22/2018 1000   LDLCALC 108 (H) 07/22/2018 1000    Physical Exam:    VS:  BP 128/64   Pulse 69   Ht 5\' 4"  (1.626 m)   Wt 143 lb (64.9 kg)   SpO2 99%   BMI 24.55 kg/m     Wt Readings from Last 3 Encounters:  10/29/20 143 lb (64.9 kg)  09/06/20 142 lb (64.4 kg)  05/02/19 142 lb (64.4 kg)     GEN: Slender and healthy appearing. No acute distress HEENT: Normal NECK: No JVD. LYMPHATICS: No lymphadenopathy CARDIAC: No murmur. RRR no gallop, or edema. VASCULAR: No normal Pulses. No bruits. RESPIRATORY:  Clear to auscultation without rales, wheezing or rhonchi  ABDOMEN: Soft, non-tender, non-distended, No pulsatile mass, MUSCULOSKELETAL: No deformity  SKIN: Warm and dry NEUROLOGIC:  Alert and oriented x 3 PSYCHIATRIC:  Normal affect   ASSESSMENT:    1. Chest pain of uncertain etiology   2. Hyperlipidemia,  unspecified hyperlipidemia type   3. Abnormal ECG during exercise stress test   4. Snoring   5. Excessive daytime sleepiness   6. Prediabetes    PLAN:    In order of problems listed above:  1. At night.  Never with physical activity.  I wonder if this has something to do with sleep disturbance or sleep apnea. 2. Not currently treated.  Coronary calcium score to help further define risk. 3. ECG has  nonspecific ST abnormality.  Overall my interpretation is that the EKG is her normal pattern and does not represent pathology. 4. Repeat sleep study 5. Repeat sleep study 6. This is a risk factor along with family history and elevated lipids.  Overall education and awareness concerning primary prevention was discussed in detail: LDL less than 70, hemoglobin A1c less than 7, blood pressure target less than 130/80 mmHg, >150 minutes of moderate aerobic activity per week, avoidance of smoking, weight control (via diet and exercise), and continued surveillance/management of/for obstructive sleep apnea.  Coronary calcium score and sleep study ordered.  Medication Adjustments/Labs and Tests Ordered: Current medicines are reviewed at length with the patient today.  Concerns regarding medicines are outlined above.  Orders Placed This Encounter  Procedures  . CT CARDIAC SCORING (SELF PAY ONLY)  . Split night study   No orders of the defined types were placed in this encounter.   Patient Instructions  Medication Instructions:  Your physician recommends that you continue on your current medications as directed. Please refer to the Current Medication list given to you today.  *If you need a refill on your cardiac medications before your next appointment, please call your pharmacy*   Lab Work: None If you have labs (blood work) drawn today and your tests are completely normal, you will receive your results only by: Marland Kitchen MyChart Message (if you have MyChart) OR . A paper copy in the mail If you  have any lab test that is abnormal or we need to change your treatment, we will call you to review the results.   Testing/Procedures: Your physician recommends that you have a Calcium Score performed.  Your physician has recommended that you have a sleep study. This test records several body functions during sleep, including: brain activity, eye movement, oxygen and carbon dioxide blood levels, heart rate and rhythm, breathing rate and rhythm, the flow of air through your mouth and nose, snoring, body muscle movements, and chest and belly movement.   Follow-Up: At [facility] [place], you and your health needs are our priority.  As part of our continuing mission to provide you with exceptional heart care, we have created designated Provider Care Teams.  These Care Teams include your primary Cardiologist (physician) and Advanced Practice Providers (APPs -  Physician Assistants and Nurse Practitioners) who all work together to provide you with the care you need, when you need it.  We recommend signing up for the patient portal called "MyChart".  Sign up information is provided on this After Visit Summary.  MyChart is used to connect with patients for Virtual Visits (Telemedicine).  Patients are able to view lab/test results, encounter notes, upcoming appointments, etc.  Non-urgent messages can be sent to your provider as well.   To learn more about what you can do with MyChart, go to [email or web address].    Your next appointment:   As needed  The format for your next appointment:   In Person  Provider:   You may see Dr. Daneen Schick or one of the following Advanced Practice Providers on your designated Care Team:    Kathyrn Drown, NP    Other Instructions      Signed, Sinclair Grooms, MD  10/29/2020 11:20 AM    [place]

## 2020-10-29 ENCOUNTER — Other Ambulatory Visit: Payer: Self-pay

## 2020-10-29 ENCOUNTER — Ambulatory Visit: Payer: Medicare HMO | Admitting: Interventional Cardiology

## 2020-10-29 ENCOUNTER — Encounter: Payer: Self-pay | Admitting: Interventional Cardiology

## 2020-10-29 ENCOUNTER — Telehealth: Payer: Self-pay | Admitting: *Deleted

## 2020-10-29 VITALS — BP 128/64 | HR 69 | Ht 64.0 in | Wt 143.0 lb

## 2020-10-29 DIAGNOSIS — G4719 Other hypersomnia: Secondary | ICD-10-CM

## 2020-10-29 DIAGNOSIS — E785 Hyperlipidemia, unspecified: Secondary | ICD-10-CM

## 2020-10-29 DIAGNOSIS — R079 Chest pain, unspecified: Secondary | ICD-10-CM | POA: Diagnosis not present

## 2020-10-29 DIAGNOSIS — R7303 Prediabetes: Secondary | ICD-10-CM

## 2020-10-29 DIAGNOSIS — R0683 Snoring: Secondary | ICD-10-CM | POA: Diagnosis not present

## 2020-10-29 DIAGNOSIS — R9431 Abnormal electrocardiogram [ECG] [EKG]: Secondary | ICD-10-CM | POA: Diagnosis not present

## 2020-10-29 NOTE — Patient Instructions (Signed)
Medication Instructions:  Your physician recommends that you continue on your current medications as directed. Please refer to the Current Medication list given to you today.  *If you need a refill on your cardiac medications before your next appointment, please call your pharmacy*   Lab Work: None If you have labs (blood work) drawn today and your tests are completely normal, you will receive your results only by: Marland Kitchen MyChart Message (if you have MyChart) OR . A paper copy in the mail If you have any lab test that is abnormal or we need to change your treatment, we will call you to review the results.   Testing/Procedures: Your physician recommends that you have a Calcium Score performed.  Your physician has recommended that you have a sleep study. This test records several body functions during sleep, including: brain activity, eye movement, oxygen and carbon dioxide blood levels, heart rate and rhythm, breathing rate and rhythm, the flow of air through your mouth and nose, snoring, body muscle movements, and chest and belly movement.   Follow-Up: At [facility], you and your health needs are our priority.  As part of our continuing mission to provide you with exceptional heart care, we have created designated Provider Care Teams.  These Care Teams include your primary Cardiologist (physician) and Advanced Practice Providers (APPs -  Physician Assistants and Nurse Practitioners) who all work together to provide you with the care you need, when you need it.  We recommend signing up for the patient portal called "MyChart".  Sign up information is provided on this After Visit Summary.  MyChart is used to connect with patients for Virtual Visits (Telemedicine).  Patients are able to view lab/test results, encounter notes, upcoming appointments, etc.  Non-urgent messages can be sent to your provider as well.   To learn more about what you can do with MyChart, go to [email or web address].     Your next appointment:   As needed  The format for your next appointment:   In Person  Provider:   You may see Dr. Daneen Schick or one of the following Advanced Practice Providers on your designated Care Team:    Kathyrn Drown, NP    Other Instructions

## 2020-10-29 NOTE — Telephone Encounter (Signed)
-----   Message from Jennifer L Bowers, RN sent at 10/29/2020 10:24 AM EDT ----- Sleep Study ordered  

## 2020-10-31 ENCOUNTER — Telehealth: Payer: Self-pay | Admitting: *Deleted

## 2020-10-31 NOTE — Telephone Encounter (Signed)
-----   Message from Loren Racer, RN sent at 10/29/2020 10:24 AM EDT ----- Sleep Study ordered

## 2020-10-31 NOTE — Telephone Encounter (Signed)
Prior Authorization for split night sleep study sent to [facility] via [employer] web portal. Case number: [id number].

## 2020-11-08 NOTE — Telephone Encounter (Signed)
Per [employer] No PA is required for sleep study. Call reference # [id number]. Ok to schedule.

## 2020-11-16 DIAGNOSIS — H5203 Hypermetropia, bilateral: Secondary | ICD-10-CM | POA: Diagnosis not present

## 2020-11-16 DIAGNOSIS — H25013 Cortical age-related cataract, bilateral: Secondary | ICD-10-CM | POA: Diagnosis not present

## 2020-11-27 ENCOUNTER — Telehealth: Payer: Self-pay

## 2020-11-27 NOTE — Telephone Encounter (Signed)
Left a message asking pt to call Gae Bon regarding her sleep study appt.

## 2020-11-29 ENCOUNTER — Other Ambulatory Visit: Payer: Self-pay

## 2020-11-29 ENCOUNTER — Ambulatory Visit (INDEPENDENT_AMBULATORY_CARE_PROVIDER_SITE_OTHER)
Admission: RE | Admit: 2020-11-29 | Discharge: 2020-11-29 | Disposition: A | Discharge status: Home / Self Care | Payer: Self-pay | Source: Ambulatory Visit | Attending: Interventional Cardiology | Admitting: Interventional Cardiology

## 2020-11-29 DIAGNOSIS — R079 Chest pain, unspecified: Secondary | ICD-10-CM

## 2020-11-30 ENCOUNTER — Other Ambulatory Visit: Payer: Self-pay | Admitting: *Deleted

## 2020-11-30 ENCOUNTER — Encounter: Payer: Self-pay | Admitting: Interventional Cardiology

## 2020-11-30 DIAGNOSIS — E785 Hyperlipidemia, unspecified: Secondary | ICD-10-CM

## 2020-11-30 NOTE — Telephone Encounter (Signed)
Patient is scheduled for lab study on 8/16. Patient understands her sleep study will be done at [facility] sleep lab. Patient understands she will receive a sleep packet in a week or so. Patient understands to call if she does not receive the sleep packet in a timely manner. Patient agrees with treatment and thanked me for call. Pt is aware and agreeable to the date.

## 2020-11-30 NOTE — Telephone Encounter (Signed)
error 

## 2020-12-03 NOTE — Telephone Encounter (Signed)
Patient is scheduled for lab study on 02/06/21. Patient understands her sleep study will be done at [facility] sleep lab. Patient understands she will receive a sleep packet in a week or so. Patient understands to call if she does not receive the sleep packet in a timely manner.  Left detailed message on voicemail with date and time of titration and informed patient to call back to confirm or reschedule.

## 2020-12-20 DIAGNOSIS — E038 Other specified hypothyroidism: Secondary | ICD-10-CM | POA: Diagnosis not present

## 2021-01-29 ENCOUNTER — Encounter (HOSPITAL_BASED_OUTPATIENT_CLINIC_OR_DEPARTMENT_OTHER): Payer: Medicare HMO | Admitting: Cardiology

## 2021-02-06 ENCOUNTER — Encounter (HOSPITAL_BASED_OUTPATIENT_CLINIC_OR_DEPARTMENT_OTHER): Payer: Medicare HMO | Admitting: Cardiology

## 2021-02-07 ENCOUNTER — Other Ambulatory Visit: Payer: Medicare HMO

## 2021-02-08 ENCOUNTER — Other Ambulatory Visit: Payer: Medicare HMO

## 2021-03-08 DIAGNOSIS — E237 Disorder of pituitary gland, unspecified: Secondary | ICD-10-CM | POA: Diagnosis not present

## 2021-03-08 DIAGNOSIS — E282 Polycystic ovarian syndrome: Secondary | ICD-10-CM | POA: Diagnosis not present

## 2021-03-08 DIAGNOSIS — E279 Disorder of adrenal gland, unspecified: Secondary | ICD-10-CM | POA: Diagnosis not present

## 2021-03-08 DIAGNOSIS — E038 Other specified hypothyroidism: Secondary | ICD-10-CM | POA: Diagnosis not present

## 2021-03-08 DIAGNOSIS — E2839 Other primary ovarian failure: Secondary | ICD-10-CM | POA: Diagnosis not present

## 2021-04-17 DIAGNOSIS — N2889 Other specified disorders of kidney and ureter: Secondary | ICD-10-CM | POA: Diagnosis not present

## 2021-04-17 DIAGNOSIS — R102 Pelvic and perineal pain: Secondary | ICD-10-CM | POA: Diagnosis not present

## 2021-04-17 DIAGNOSIS — R109 Unspecified abdominal pain: Secondary | ICD-10-CM | POA: Diagnosis not present

## 2021-04-17 DIAGNOSIS — R140 Abdominal distension (gaseous): Secondary | ICD-10-CM | POA: Diagnosis not present

## 2021-07-02 DIAGNOSIS — D2262 Melanocytic nevi of left upper limb, including shoulder: Secondary | ICD-10-CM | POA: Diagnosis not present

## 2021-07-02 DIAGNOSIS — D2271 Melanocytic nevi of right lower limb, including hip: Secondary | ICD-10-CM | POA: Diagnosis not present

## 2021-07-02 DIAGNOSIS — D1801 Hemangioma of skin and subcutaneous tissue: Secondary | ICD-10-CM | POA: Diagnosis not present

## 2021-07-02 DIAGNOSIS — L814 Other melanin hyperpigmentation: Secondary | ICD-10-CM | POA: Diagnosis not present

## 2021-07-02 DIAGNOSIS — L821 Other seborrheic keratosis: Secondary | ICD-10-CM | POA: Diagnosis not present

## 2021-07-02 DIAGNOSIS — L718 Other rosacea: Secondary | ICD-10-CM | POA: Diagnosis not present

## 2021-07-02 DIAGNOSIS — Z85820 Personal history of malignant melanoma of skin: Secondary | ICD-10-CM | POA: Diagnosis not present

## 2021-07-02 DIAGNOSIS — D2272 Melanocytic nevi of left lower limb, including hip: Secondary | ICD-10-CM | POA: Diagnosis not present

## 2021-07-02 DIAGNOSIS — D2261 Melanocytic nevi of right upper limb, including shoulder: Secondary | ICD-10-CM | POA: Diagnosis not present

## 2021-08-14 DIAGNOSIS — E2839 Other primary ovarian failure: Secondary | ICD-10-CM | POA: Diagnosis not present

## 2021-08-14 DIAGNOSIS — D649 Anemia, unspecified: Secondary | ICD-10-CM | POA: Diagnosis not present

## 2021-08-14 DIAGNOSIS — J160 Chlamydial pneumonia: Secondary | ICD-10-CM | POA: Diagnosis not present

## 2021-08-14 DIAGNOSIS — R890 Abnormal level of enzymes in specimens from other organs, systems and tissues: Secondary | ICD-10-CM | POA: Diagnosis not present

## 2021-08-14 DIAGNOSIS — E559 Vitamin D deficiency, unspecified: Secondary | ICD-10-CM | POA: Diagnosis not present

## 2021-08-14 DIAGNOSIS — E279 Disorder of adrenal gland, unspecified: Secondary | ICD-10-CM | POA: Diagnosis not present

## 2021-08-14 DIAGNOSIS — R945 Abnormal results of liver function studies: Secondary | ICD-10-CM | POA: Diagnosis not present

## 2021-08-14 DIAGNOSIS — Z13220 Encounter for screening for lipoid disorders: Secondary | ICD-10-CM | POA: Diagnosis not present

## 2021-08-14 DIAGNOSIS — E038 Other specified hypothyroidism: Secondary | ICD-10-CM | POA: Diagnosis not present

## 2021-08-14 DIAGNOSIS — E237 Disorder of pituitary gland, unspecified: Secondary | ICD-10-CM | POA: Diagnosis not present

## 2021-08-14 DIAGNOSIS — Z119 Encounter for screening for infectious and parasitic diseases, unspecified: Secondary | ICD-10-CM | POA: Diagnosis not present

## 2021-08-14 DIAGNOSIS — E539 Vitamin B deficiency, unspecified: Secondary | ICD-10-CM | POA: Diagnosis not present

## 2021-10-24 DIAGNOSIS — B2710 Cytomegaloviral mononucleosis without complications: Secondary | ICD-10-CM | POA: Diagnosis not present

## 2021-10-24 DIAGNOSIS — D899 Disorder involving the immune mechanism, unspecified: Secondary | ICD-10-CM | POA: Diagnosis not present

## 2021-10-24 DIAGNOSIS — E2839 Other primary ovarian failure: Secondary | ICD-10-CM | POA: Diagnosis not present

## 2021-10-24 DIAGNOSIS — E038 Other specified hypothyroidism: Secondary | ICD-10-CM | POA: Diagnosis not present

## 2021-10-24 DIAGNOSIS — B2790 Infectious mononucleosis, unspecified without complication: Secondary | ICD-10-CM | POA: Diagnosis not present

## 2021-10-24 DIAGNOSIS — J160 Chlamydial pneumonia: Secondary | ICD-10-CM | POA: Diagnosis not present

## 2021-10-24 DIAGNOSIS — E279 Disorder of adrenal gland, unspecified: Secondary | ICD-10-CM | POA: Diagnosis not present

## 2021-11-08 ENCOUNTER — Ambulatory Visit
Admission: RE | Admit: 2021-11-08 | Discharge: 2021-11-08 | Disposition: A | Discharge status: Home / Self Care | Payer: Medicare HMO | Source: Ambulatory Visit | Attending: Physical Medicine and Rehabilitation | Admitting: Physical Medicine and Rehabilitation

## 2021-11-08 ENCOUNTER — Other Ambulatory Visit: Payer: Self-pay | Admitting: Physical Medicine and Rehabilitation

## 2021-11-08 DIAGNOSIS — R52 Pain, unspecified: Secondary | ICD-10-CM

## 2021-12-04 ENCOUNTER — Other Ambulatory Visit: Payer: Self-pay | Admitting: Physical Medicine and Rehabilitation

## 2021-12-04 DIAGNOSIS — G894 Chronic pain syndrome: Secondary | ICD-10-CM

## 2021-12-04 DIAGNOSIS — M5417 Radiculopathy, lumbosacral region: Secondary | ICD-10-CM

## 2021-12-04 DIAGNOSIS — M5137 Other intervertebral disc degeneration, lumbosacral region: Secondary | ICD-10-CM

## 2021-12-04 DIAGNOSIS — M545 Low back pain, unspecified: Secondary | ICD-10-CM

## 2021-12-09 ENCOUNTER — Ambulatory Visit
Admission: RE | Admit: 2021-12-09 | Discharge: 2021-12-09 | Disposition: A | Discharge status: Home / Self Care | Payer: PPO | Source: Ambulatory Visit | Attending: Physical Medicine and Rehabilitation | Admitting: Physical Medicine and Rehabilitation

## 2021-12-09 DIAGNOSIS — M545 Low back pain, unspecified: Secondary | ICD-10-CM

## 2021-12-09 DIAGNOSIS — M5116 Intervertebral disc disorders with radiculopathy, lumbar region: Secondary | ICD-10-CM | POA: Diagnosis not present

## 2021-12-09 DIAGNOSIS — M5417 Radiculopathy, lumbosacral region: Secondary | ICD-10-CM

## 2021-12-09 DIAGNOSIS — G894 Chronic pain syndrome: Secondary | ICD-10-CM | POA: Diagnosis not present

## 2021-12-09 DIAGNOSIS — Z85820 Personal history of malignant melanoma of skin: Secondary | ICD-10-CM | POA: Diagnosis not present

## 2021-12-09 DIAGNOSIS — M5137 Other intervertebral disc degeneration, lumbosacral region: Secondary | ICD-10-CM

## 2021-12-09 DIAGNOSIS — M4316 Spondylolisthesis, lumbar region: Secondary | ICD-10-CM | POA: Diagnosis not present

## 2021-12-09 MED ORDER — GADOBENATE DIMEGLUMINE 529 MG/ML IV SOLN
14.0000 mL | Freq: Once | INTRAVENOUS | Status: AC | PRN
  Administered 2021-12-09: 14 mL via INTRAVENOUS

## 2021-12-25 ENCOUNTER — Other Ambulatory Visit: Payer: Medicare HMO

## 2022-01-02 DIAGNOSIS — M6281 Muscle weakness (generalized): Secondary | ICD-10-CM | POA: Diagnosis not present

## 2022-01-02 DIAGNOSIS — M79605 Pain in left leg: Secondary | ICD-10-CM | POA: Diagnosis not present

## 2022-01-13 DIAGNOSIS — Z1211 Encounter for screening for malignant neoplasm of colon: Secondary | ICD-10-CM | POA: Diagnosis not present

## 2022-01-13 DIAGNOSIS — Z0000 Encounter for general adult medical examination without abnormal findings: Secondary | ICD-10-CM | POA: Diagnosis not present

## 2022-01-21 DIAGNOSIS — M79605 Pain in left leg: Secondary | ICD-10-CM | POA: Diagnosis not present

## 2022-01-21 DIAGNOSIS — M6281 Muscle weakness (generalized): Secondary | ICD-10-CM | POA: Diagnosis not present

## 2022-01-23 DIAGNOSIS — M6281 Muscle weakness (generalized): Secondary | ICD-10-CM | POA: Diagnosis not present

## 2022-01-23 DIAGNOSIS — M79605 Pain in left leg: Secondary | ICD-10-CM | POA: Diagnosis not present

## 2022-01-28 DIAGNOSIS — M6281 Muscle weakness (generalized): Secondary | ICD-10-CM | POA: Diagnosis not present

## 2022-01-28 DIAGNOSIS — M79605 Pain in left leg: Secondary | ICD-10-CM | POA: Diagnosis not present

## 2022-01-30 DIAGNOSIS — M79605 Pain in left leg: Secondary | ICD-10-CM | POA: Diagnosis not present

## 2022-01-30 DIAGNOSIS — M6281 Muscle weakness (generalized): Secondary | ICD-10-CM | POA: Diagnosis not present

## 2022-02-03 DIAGNOSIS — H903 Sensorineural hearing loss, bilateral: Secondary | ICD-10-CM | POA: Diagnosis not present

## 2022-02-04 DIAGNOSIS — M6281 Muscle weakness (generalized): Secondary | ICD-10-CM | POA: Diagnosis not present

## 2022-02-04 DIAGNOSIS — M79605 Pain in left leg: Secondary | ICD-10-CM | POA: Diagnosis not present

## 2022-02-06 DIAGNOSIS — M6281 Muscle weakness (generalized): Secondary | ICD-10-CM | POA: Diagnosis not present

## 2022-02-06 DIAGNOSIS — M79605 Pain in left leg: Secondary | ICD-10-CM | POA: Diagnosis not present

## 2022-02-11 DIAGNOSIS — M6281 Muscle weakness (generalized): Secondary | ICD-10-CM | POA: Diagnosis not present

## 2022-02-11 DIAGNOSIS — M79605 Pain in left leg: Secondary | ICD-10-CM | POA: Diagnosis not present

## 2022-04-03 ENCOUNTER — Telehealth (HOSPITAL_BASED_OUTPATIENT_CLINIC_OR_DEPARTMENT_OTHER): Payer: Self-pay

## 2022-04-03 NOTE — Telephone Encounter (Signed)
Voicemail from pt received on Dr. Ammie Ferrier voicemail.  Pt requesting refill for estradiol patch.  She will be a new patient for Emeterio Reeve Early.

## 2022-04-04 NOTE — Telephone Encounter (Signed)
There is not an appointment scheduled with me for this patient and have not seen her in the past. Please let her know that I am unable to provide the refill on her medication without evaluation.   It does appear she has a new patient appt with Dr. Sabra Heck in February.

## 2022-04-11 DIAGNOSIS — H903 Sensorineural hearing loss, bilateral: Secondary | ICD-10-CM | POA: Diagnosis not present

## 2022-04-15 DIAGNOSIS — Z86010 Personal history of colonic polyps: Secondary | ICD-10-CM | POA: Diagnosis not present

## 2022-04-15 DIAGNOSIS — K5900 Constipation, unspecified: Secondary | ICD-10-CM | POA: Diagnosis not present

## 2022-04-15 DIAGNOSIS — R140 Abdominal distension (gaseous): Secondary | ICD-10-CM | POA: Diagnosis not present

## 2022-04-15 DIAGNOSIS — Z1211 Encounter for screening for malignant neoplasm of colon: Secondary | ICD-10-CM | POA: Diagnosis not present

## 2022-04-15 DIAGNOSIS — K5730 Diverticulosis of large intestine without perforation or abscess without bleeding: Secondary | ICD-10-CM | POA: Diagnosis not present

## 2022-04-18 DIAGNOSIS — Z01419 Encounter for gynecological examination (general) (routine) without abnormal findings: Secondary | ICD-10-CM | POA: Diagnosis not present

## 2022-04-18 DIAGNOSIS — Z1231 Encounter for screening mammogram for malignant neoplasm of breast: Secondary | ICD-10-CM | POA: Diagnosis not present

## 2022-04-18 DIAGNOSIS — Z6824 Body mass index (BMI) 24.0-24.9, adult: Secondary | ICD-10-CM | POA: Diagnosis not present

## 2022-04-18 DIAGNOSIS — N951 Menopausal and female climacteric states: Secondary | ICD-10-CM | POA: Diagnosis not present

## 2022-04-22 DIAGNOSIS — H6122 Impacted cerumen, left ear: Secondary | ICD-10-CM | POA: Diagnosis not present

## 2022-05-01 IMAGING — DX DG CHEST 2V
2 series · 2 of 2 positions shown · non-contrast
Comparison: 03/31/2011

CLINICAL DATA: Chest pain

EXAM:
CHEST - 2 VIEW

[chest pa]
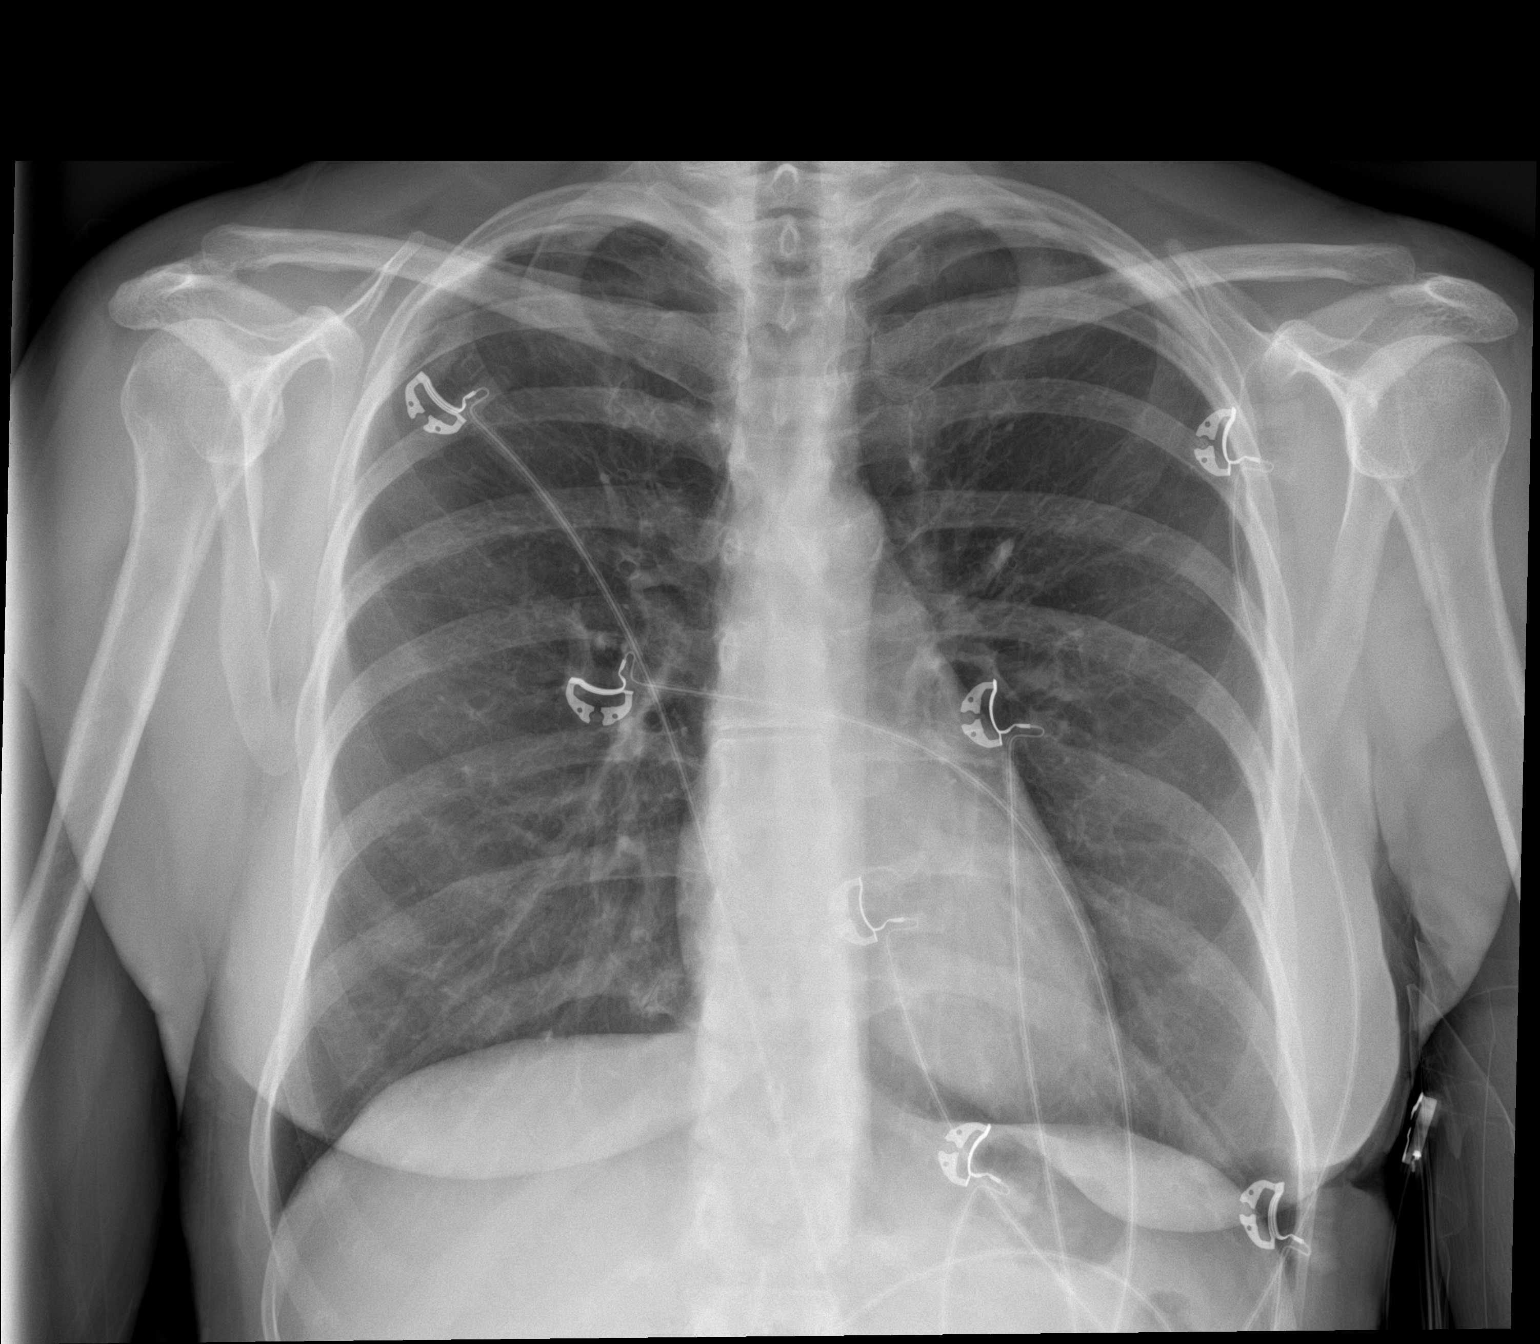

[chest lat]
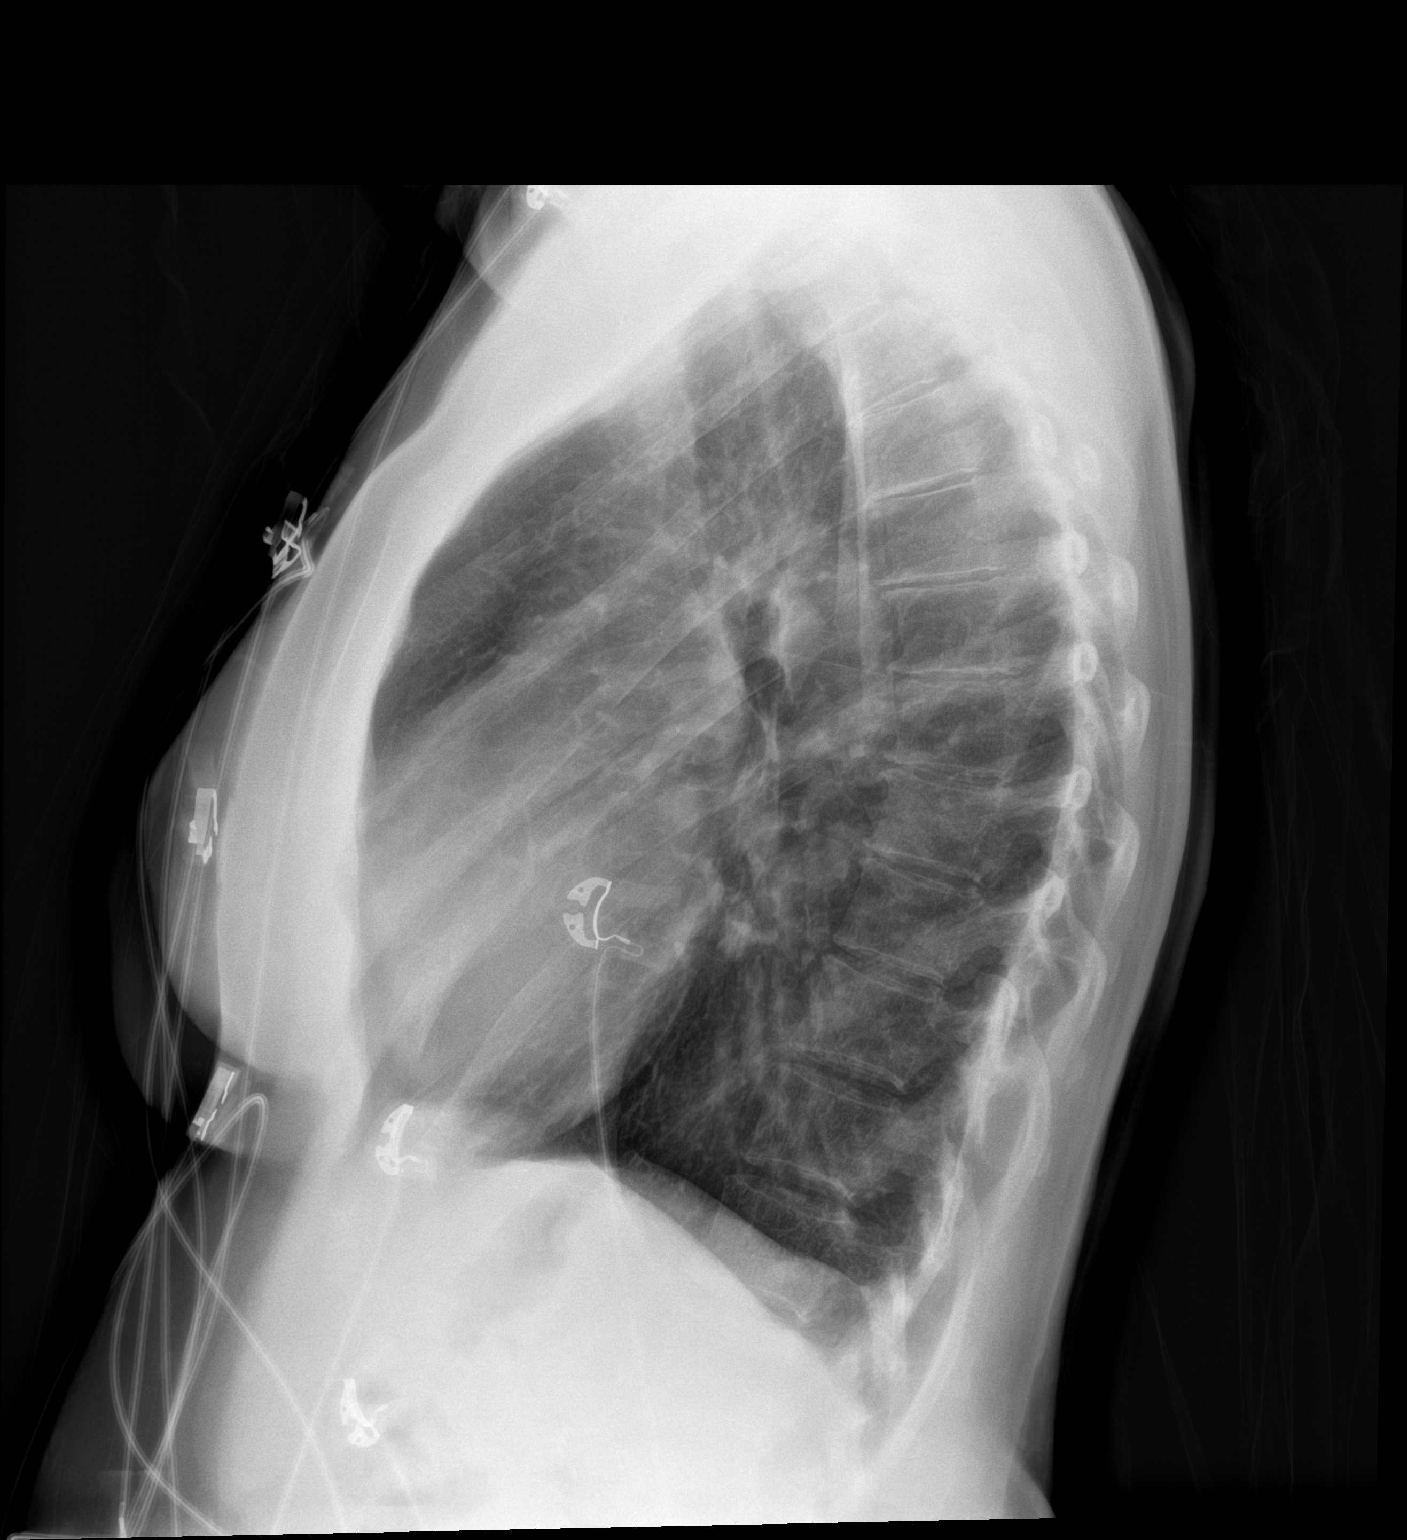

[2 of 2 positions shown; findings below may reference images not displayed]

FINDINGS: The lungs are clear without focal pneumonia, edema, pneumothorax or
pleural effusion. The cardiopericardial silhouette is within normal
limits for size. The visualized bony structures of the thorax show
no acute abnormality.
IMPRESSION: No active cardiopulmonary disease.

## 2022-05-02 DIAGNOSIS — R0683 Snoring: Secondary | ICD-10-CM | POA: Diagnosis not present

## 2022-05-02 DIAGNOSIS — H93A3 Pulsatile tinnitus, bilateral: Secondary | ICD-10-CM | POA: Diagnosis not present

## 2022-05-02 DIAGNOSIS — J343 Hypertrophy of nasal turbinates: Secondary | ICD-10-CM | POA: Diagnosis not present

## 2022-05-02 DIAGNOSIS — J3489 Other specified disorders of nose and nasal sinuses: Secondary | ICD-10-CM | POA: Diagnosis not present

## 2022-05-02 DIAGNOSIS — H903 Sensorineural hearing loss, bilateral: Secondary | ICD-10-CM | POA: Diagnosis not present

## 2022-05-13 DIAGNOSIS — E038 Other specified hypothyroidism: Secondary | ICD-10-CM | POA: Diagnosis not present

## 2022-05-31 DIAGNOSIS — G4730 Sleep apnea, unspecified: Secondary | ICD-10-CM | POA: Diagnosis not present

## 2022-06-24 ENCOUNTER — Other Ambulatory Visit (HOSPITAL_COMMUNITY): Payer: Self-pay | Admitting: Otolaryngology

## 2022-06-24 DIAGNOSIS — H93A3 Pulsatile tinnitus, bilateral: Secondary | ICD-10-CM

## 2022-06-26 ENCOUNTER — Ambulatory Visit (HOSPITAL_COMMUNITY)
Admission: RE | Admit: 2022-06-26 | Discharge: 2022-06-26 | Disposition: A | Discharge status: Home / Self Care | Payer: PPO | Source: Ambulatory Visit | Attending: Otolaryngology | Admitting: Otolaryngology

## 2022-06-26 DIAGNOSIS — H93A3 Pulsatile tinnitus, bilateral: Secondary | ICD-10-CM | POA: Insufficient documentation

## 2022-06-26 NOTE — Progress Notes (Signed)
VASCULAR LAB    Carotid duplex has been performed.  See CV proc for preliminary results.   Kaytlynn Kochan, RVT 06/26/2022, 2:22 PM

## 2022-07-11 DIAGNOSIS — G4733 Obstructive sleep apnea (adult) (pediatric): Secondary | ICD-10-CM | POA: Diagnosis not present

## 2022-07-17 DIAGNOSIS — E2839 Other primary ovarian failure: Secondary | ICD-10-CM | POA: Diagnosis not present

## 2022-07-17 DIAGNOSIS — D519 Vitamin B12 deficiency anemia, unspecified: Secondary | ICD-10-CM | POA: Diagnosis not present

## 2022-07-17 DIAGNOSIS — E559 Vitamin D deficiency, unspecified: Secondary | ICD-10-CM | POA: Diagnosis not present

## 2022-07-17 DIAGNOSIS — E531 Pyridoxine deficiency: Secondary | ICD-10-CM | POA: Diagnosis not present

## 2022-07-17 DIAGNOSIS — E038 Other specified hypothyroidism: Secondary | ICD-10-CM | POA: Diagnosis not present

## 2022-07-17 DIAGNOSIS — D509 Iron deficiency anemia, unspecified: Secondary | ICD-10-CM | POA: Diagnosis not present

## 2022-07-17 DIAGNOSIS — Z131 Encounter for screening for diabetes mellitus: Secondary | ICD-10-CM | POA: Diagnosis not present

## 2022-08-11 DIAGNOSIS — G4733 Obstructive sleep apnea (adult) (pediatric): Secondary | ICD-10-CM | POA: Diagnosis not present

## 2022-08-13 DIAGNOSIS — D2272 Melanocytic nevi of left lower limb, including hip: Secondary | ICD-10-CM | POA: Diagnosis not present

## 2022-08-13 DIAGNOSIS — Z85820 Personal history of malignant melanoma of skin: Secondary | ICD-10-CM | POA: Diagnosis not present

## 2022-08-13 DIAGNOSIS — D1801 Hemangioma of skin and subcutaneous tissue: Secondary | ICD-10-CM | POA: Diagnosis not present

## 2022-08-13 DIAGNOSIS — D2261 Melanocytic nevi of right upper limb, including shoulder: Secondary | ICD-10-CM | POA: Diagnosis not present

## 2022-08-13 DIAGNOSIS — L814 Other melanin hyperpigmentation: Secondary | ICD-10-CM | POA: Diagnosis not present

## 2022-08-13 DIAGNOSIS — L821 Other seborrheic keratosis: Secondary | ICD-10-CM | POA: Diagnosis not present

## 2022-08-13 DIAGNOSIS — L718 Other rosacea: Secondary | ICD-10-CM | POA: Diagnosis not present

## 2022-08-13 DIAGNOSIS — D2262 Melanocytic nevi of left upper limb, including shoulder: Secondary | ICD-10-CM | POA: Diagnosis not present

## 2022-08-14 ENCOUNTER — Ambulatory Visit (HOSPITAL_BASED_OUTPATIENT_CLINIC_OR_DEPARTMENT_OTHER): Payer: PPO | Admitting: Obstetrics & Gynecology

## 2022-09-09 DIAGNOSIS — G4733 Obstructive sleep apnea (adult) (pediatric): Secondary | ICD-10-CM | POA: Diagnosis not present

## 2022-10-29 DIAGNOSIS — G4733 Obstructive sleep apnea (adult) (pediatric): Secondary | ICD-10-CM | POA: Diagnosis not present

## 2022-11-20 DIAGNOSIS — E279 Disorder of adrenal gland, unspecified: Secondary | ICD-10-CM | POA: Diagnosis not present

## 2022-11-20 DIAGNOSIS — E038 Other specified hypothyroidism: Secondary | ICD-10-CM | POA: Diagnosis not present

## 2022-11-20 DIAGNOSIS — E2839 Other primary ovarian failure: Secondary | ICD-10-CM | POA: Diagnosis not present

## 2022-11-20 DIAGNOSIS — E282 Polycystic ovarian syndrome: Secondary | ICD-10-CM | POA: Diagnosis not present

## 2022-11-24 DIAGNOSIS — G4733 Obstructive sleep apnea (adult) (pediatric): Secondary | ICD-10-CM | POA: Diagnosis not present

## 2022-11-24 DIAGNOSIS — J310 Chronic rhinitis: Secondary | ICD-10-CM | POA: Diagnosis not present

## 2022-11-24 DIAGNOSIS — H903 Sensorineural hearing loss, bilateral: Secondary | ICD-10-CM | POA: Diagnosis not present

## 2022-11-24 DIAGNOSIS — H9313 Tinnitus, bilateral: Secondary | ICD-10-CM | POA: Diagnosis not present

## 2023-01-09 DIAGNOSIS — G4733 Obstructive sleep apnea (adult) (pediatric): Secondary | ICD-10-CM | POA: Diagnosis not present

## 2023-02-04 DIAGNOSIS — G4733 Obstructive sleep apnea (adult) (pediatric): Secondary | ICD-10-CM | POA: Diagnosis not present

## 2023-02-09 DIAGNOSIS — G4733 Obstructive sleep apnea (adult) (pediatric): Secondary | ICD-10-CM | POA: Diagnosis not present

## 2023-02-13 DIAGNOSIS — N819 Female genital prolapse, unspecified: Secondary | ICD-10-CM | POA: Diagnosis not present

## 2023-02-25 NOTE — Therapy (Signed)
OUTPATIENT PHYSICAL THERAPY FEMALE PELVIC EVALUATION   Patient Name: Olivia Morrison MRN: [id number] DOB:1950-08-31, 72 y.o., female Today's Date: 02/26/2023  END OF SESSION:  PT End of Session - 02/26/23 0902     Visit Number 1    Date for PT Re-Evaluation 04/23/23    Authorization Type HTA    Progress Note Due on Visit 10    PT Start Time 0845    PT Stop Time 0925    PT Time Calculation (min) 40 min    Activity Tolerance Patient tolerated treatment well    Behavior During Therapy [facility] for tasks assessed/performed             Past Medical History:  Diagnosis Date   Abnormal stress test    2016, saw cards, nuclear scan planned   Depression    seeing [name] - psychologist   GERD (gastroesophageal reflux disease)    Melanoma (HCC)    in 2016, removed; seeing Dr. Nicholas Lose   Past Surgical History:  Procedure Laterality Date   ABDOMINAL HYSTERECTOMY  1993   leiomyomata, bleeding   BREAST BIOPSY Right 2015   Eye lid surg     Melanoma excised     TONSILLECTOMY  1959   Patient Active Problem List   Diagnosis Date Noted   Hypothyroidism 07/22/2017   Menopausal hot flushes 07/22/2017   Snoring 08/07/2015   Rhinitis, chronic 08/07/2015   Bereavement 05/07/2015   Melanoma of skin (HCC) 05/07/2015   Abnormal ECG during exercise stress test 05/01/2015   Hyperlipidemia 05/01/2015    PCP: Lewis Moccasin, MD  REFERRING PROVIDER: Zelphia Cairo, MD   REFERRING DIAG: N81.10 (ICD-10-CM) - Cystocele, unspecified  THERAPY DIAG:  Muscle weakness (generalized)  Unspecified lack of coordination  Urge incontinence  Other low back pain  Rationale for Evaluation and Treatment: Rehabilitation  ONSET DATE: 2 weeks   SUBJECTIVE:                                                                                                                                                                                           SUBJECTIVE STATEMENT: Pt states that she first felt  vaginal bulge 2 weeks ago during IBS flare up. She is now working on controlling her constipation. She works on her eating habits to help regulate. She has increased walking and is drinking prune juice.  Fluid intake: Yes: a lot of water    PAIN:  Are you having pain? Yes NPRS scale: 8/10 Pain location:  lower abdominal cramping/bloating; low back pain/hip pain  Pain type: aching and burning Pain description: intermittent   Aggravating factors: no pattern Relieving  factors: stretches  PRECAUTIONS: None  RED FLAGS: None   WEIGHT BEARING RESTRICTIONS: No  FALLS:  Has patient fallen in last 6 months? No  LIVING ENVIRONMENT: Lives with: lives with their family Lives in: House/apartment  OCCUPATION: retired  PLOF: Independent  PATIENT GOALS: strengthen pelvic floor  PERTINENT HISTORY:  L4-S1 degenerative disc, stenosis L5-S1  BOWEL MOVEMENT: Pain with bowel movement: No Type of bowel movement:Frequency every day to every other day and Strain Yes Fully empty rectum: No Leakage: Yes: when she is having IBS flare Pads: No Fiber supplement: No - no sugar wheat or dairy   URINATION: Pain with urination: No Fully empty bladder: Yes: sometimes will leak when she stands up Stream: Strong Urgency: Yes: will have urgency, rarely leaks  Frequency: 2x/hour Leakage: Urge to void and Walking to the bathroom Pads: No  INTERCOURSE: Not sexually active, no history of pain  PREGNANCY: Vaginal deliveries 3 Tearing Yes: with first  PROLAPSE: Cystocele feels bulge in vagina   OBJECTIVE:  02/26/23:  COGNITION: Overall cognitive status: Within functional limits for tasks assessed     SENSATION: Light touch: Appears intact Proprioception: Appears intact  MUSCLE LENGTH:   FUNCTIONAL TESTS:    GAIT: Comments: WNL  POSTURE: rounded shoulders, forward head, decreased lumbar lordosis, and posterior pelvic tilt   PALPATION:   General  generalized abdominal  restriction, no tenderness                External Perineal Exam some redness and white areas between labia; labial fusion                             Internal Pelvic Floor WNL  Patient confirms identification and approves PT to assess internal pelvic floor and treatment Yes  PELVIC MMT:   MMT eval  Vaginal 0/5, 1/5 with training, 3 second hold, 5 repeat contraction  Diastasis Recti 1 finger width  (Blank rows = not tested)        TONE: WNL  PROLAPSE: Grade 2 anterior vaginal wall laxity  TODAY'S TREATMENT:                                                                                                                              DATE:  02/26/23  EVAL  Neuromuscular re-education: Pelvic floor contraction training: Quick flicks Long holds Therapeutic activities: Squatty potty/relaxed toilet mechanics Bowel massage Urge drill    PATIENT EDUCATION:  Education details: See above Person educated: Patient Education method: Explanation, Demonstration, Tactile cues, Verbal cues, and Handouts Education comprehension: verbalized understanding  HOME EXERCISE PROGRAM: [id number]  ASSESSMENT:  CLINICAL IMPRESSION: Patient is a 72 y.o. female who was seen today for physical therapy evaluation and treatment for pelvic floor weakness. Exam findings notable for abnormal posture, core weakness, abdominal restriction, pelvic floor muscle weakness, decreased pelvic floor endurance, anterior vaginal wall laxity, and decreased coordination of pelvic floor. Signs and symptoms  are most consistent with pelvic floor muscle weakness, poor bladder habits, and poor pressure management. Initial treatment consisted of pelvic floor contraction training, pressure management education, squatty potty/relaxed toilet mechanics, urge drill, and self-bowel massage. She will continue to benefit form skilled PT intervention in order to strengthen pelvic floor, improve pressure management, and begin/progress  functional strengthening program.   OBJECTIVE IMPAIRMENTS: decreased activity tolerance, decreased coordination, decreased endurance, decreased strength, increased fascial restrictions, increased muscle spasms, postural dysfunction, and pain.   ACTIVITY LIMITATIONS: continence  PARTICIPATION LIMITATIONS: community activity  PERSONAL FACTORS: 1 comorbidity: medical history  are also affecting patient's functional outcome.   REHAB POTENTIAL: Good  CLINICAL DECISION MAKING: Stable/uncomplicated  EVALUATION COMPLEXITY: Low   GOALS: Goals reviewed with patient? Yes  SHORT TERM GOALS: Target date: 03/26/23  Pt will be independent with HEP.   Baseline: Goal status: INITIAL  2.  Pt will be independent with the knack, urge suppression technique, and double voiding in order to improve bladder habits and decrease urinary incontinence.   Baseline:  Goal status: INITIAL  3.  Pt will be independent with use of squatty potty, relaxed toileting mechanics, and improved bowel movement techniques in order to increase ease of bowel movements and complete evacuation.   Baseline:  Goal status: INITIAL  4.  Pt will be able to correctly perform diaphragmatic breathing and appropriate pressure management in order to prevent worsening vaginal wall laxity and improve pelvic floor A/ROM.   Baseline:  Goal status: INITIAL   LONG TERM GOALS: Target date: 04/23/23  Pt will be independent with advanced HEP.   Baseline:  Goal status: INITIAL  2.  Pt will demonstrate normal pelvic floor muscle tone and A/ROM, able to achieve 4/5 strength with contractions and 10 sec endurance, in order to provide appropriate lumbopelvic support in functional activities.   Baseline:  Goal status: INITIAL  3.  Pt will demonstrate appropriate pressure management and breathing with squatting/carrying 15lb KB.  Baseline:  Goal status: INITIAL  4.  Pt will be able to go 2-3 hours in between voids without urgency or  incontinence in order to improve QOL and perform all functional activities with less difficulty.   Baseline:  Goal status: INITIAL   PLAN:  PT FREQUENCY: 1-2x/week  PT DURATION: 8 weeks  PLANNED INTERVENTIONS: Therapeutic exercises, Therapeutic activity, Neuromuscular re-education, Balance training, Gait training, Patient/Family education, Self Care, Joint mobilization, Dry Needling, Biofeedback, and Manual therapy  PLAN FOR NEXT SESSION: begin core training; double voiding   Julio Alm, PT, DPT09/05/2409:11 AM

## 2023-02-26 ENCOUNTER — Other Ambulatory Visit: Payer: Self-pay

## 2023-02-26 ENCOUNTER — Ambulatory Visit: Payer: PPO | Attending: Obstetrics and Gynecology

## 2023-02-26 DIAGNOSIS — R279 Unspecified lack of coordination: Secondary | ICD-10-CM | POA: Insufficient documentation

## 2023-02-26 DIAGNOSIS — M5459 Other low back pain: Secondary | ICD-10-CM | POA: Diagnosis not present

## 2023-02-26 DIAGNOSIS — N3941 Urge incontinence: Secondary | ICD-10-CM | POA: Diagnosis not present

## 2023-02-26 DIAGNOSIS — M6281 Muscle weakness (generalized): Secondary | ICD-10-CM | POA: Diagnosis not present

## 2023-02-26 NOTE — Patient Instructions (Addendum)
Squatty potty: When your knees are level or below the level of your hips, pelvic floor muscles are pressed against rectum, preventing ease of bowel movement. By getting knees above the level of the hips, these pelvic floor muscles relax, allowing easier passage of bowel movement. ? Ways to get knees above hips: o Squatty Potty (7inch and 9inch versions) o Small stool o Roll of toilet paper under each foot o Hardback book or stack of magazines under each foot  Relaxed Toileting mechanics: Once in this position, make sure to lean forward with forearms on thighs, wide knees, relaxed stomach, and breathe.   Bowel massage: To assist with more regular and more comfortable bowel movements, try performing bowel massage nightly for 5-10 minutes. Place hands in the lower right side of your abdomen to start; in small circles, massage up, across, and down the left side of your abdomen. Pressure does not need to be hard, but just comfortable. You can use lotion or oil to make more comfortable.   Urge Incontinence  Ideal urination frequency is every 2-4 wakeful hours, which equates to 5-8 times within a 24-hour period.   Urge incontinence is leakage that occurs when the bladder muscle contracts, creating a sudden need to go before getting to the bathroom.   Going too often when your bladder isn't actually full can disrupt the body's automatic signals to store and hold urine longer, which will increase urgency/frequency.  In this case, the bladder "is running the show" and strategies can be learned to retrain this pattern.   One should be able to control the first urge to urinate, at around .  The bladder can hold up to a "grande latte," or . To help you gain control, practice the Urge Drill below when urgency strikes.  This drill will help retrain your bladder signals and allow you to store and hold urine longer.  The overall goal is to stretch out your time between voids to reach a more  manageable voiding schedule.    Practice your "quick flicks" often throughout the day (each waking hour) even when you don't need feel the urge to go.  This will help strengthen your pelvic floor muscles, making them more effective in controlling leakage.  Urge Drill  When you feel an urge to go, follow these steps to regain control: Stop what you are doing and be still Take one deep breath, directing your air into your abdomen Think an affirming thought, such as "I've got this." Do 5 quick flicks of your pelvic floor Walk with control to the bathroom to void, or delay voiding      Relieving pelvic pressure: Lying in an inverted position can help relieve the sensation of pelvic pressure Your legs can be up the wall, on a chair, or over the couch; the important thing is to make sure you have hips are elevated  Perform this position: When you feel pressure throughout the day At the end of the day While in this position, perform deep, diaphragmatic breathing to help all of your muscles relax    [facility] [address] [place], [place] [address] Phone # [phone] Fax [phone]

## 2023-03-12 DIAGNOSIS — G4733 Obstructive sleep apnea (adult) (pediatric): Secondary | ICD-10-CM | POA: Diagnosis not present

## 2023-03-16 ENCOUNTER — Ambulatory Visit: Payer: PPO

## 2023-03-19 DIAGNOSIS — K5900 Constipation, unspecified: Secondary | ICD-10-CM | POA: Diagnosis not present

## 2023-03-19 DIAGNOSIS — K6289 Other specified diseases of anus and rectum: Secondary | ICD-10-CM | POA: Diagnosis not present

## 2023-03-24 ENCOUNTER — Ambulatory Visit: Payer: PPO | Attending: Obstetrics and Gynecology

## 2023-03-24 DIAGNOSIS — M6281 Muscle weakness (generalized): Secondary | ICD-10-CM | POA: Diagnosis not present

## 2023-03-24 DIAGNOSIS — R279 Unspecified lack of coordination: Secondary | ICD-10-CM

## 2023-03-24 DIAGNOSIS — M5459 Other low back pain: Secondary | ICD-10-CM | POA: Diagnosis not present

## 2023-03-24 NOTE — Therapy (Signed)
OUTPATIENT PHYSICAL THERAPY FEMALE PELVIC EVALUATION   Patient Name: Olivia Morrison MRN: [id number] DOB:November 26, 1950, 72 y.o., female Today's Date: 03/24/2023  END OF SESSION:  PT End of Session - 03/24/23 1445     Visit Number 2    Date for PT Re-Evaluation 04/23/23    Authorization Type HTA    Progress Note Due on Visit 10    PT Start Time 1445    PT Stop Time 1525    PT Time Calculation (min) 40 min    Activity Tolerance Patient tolerated treatment well    Behavior During Therapy [facility] for tasks assessed/performed             Past Medical History:  Diagnosis Date   Abnormal stress test    2016, saw cards, nuclear scan planned   Depression    seeing [name] - psychologist   GERD (gastroesophageal reflux disease)    Melanoma (HCC)    in 2016, removed; seeing Dr. Nicholas Lose   Past Surgical History:  Procedure Laterality Date   ABDOMINAL HYSTERECTOMY  1993   leiomyomata, bleeding   BREAST BIOPSY Right 2015   Eye lid surg     Melanoma excised     TONSILLECTOMY  1959   Patient Active Problem List   Diagnosis Date Noted   Hypothyroidism 07/22/2017   Menopausal hot flushes 07/22/2017   Snoring 08/07/2015   Rhinitis, chronic 08/07/2015   Bereavement 05/07/2015   Melanoma of skin (HCC) 05/07/2015   Abnormal ECG during exercise stress test 05/01/2015   Hyperlipidemia 05/01/2015    PCP: Lewis Moccasin, MD  REFERRING PROVIDER: Zelphia Cairo, MD   REFERRING DIAG: N81.10 (ICD-10-CM) - Cystocele, unspecified  THERAPY DIAG:  Muscle weakness (generalized)  Unspecified lack of coordination  Other low back pain  Rationale for Evaluation and Treatment: Rehabilitation  ONSET DATE: 2 weeks   SUBJECTIVE:                                                                                                                                                                                           SUBJECTIVE STATEMENT: Pt states that she has just had appointment with  integrative MD and they did a CT scan. CT scan demonstrated two scars on bladder that have not changed since CT scan 2 years ago.   PAIN:  Are you having pain? Yes NPRS scale: 8/10 Pain location:  lower abdominal cramping/bloating; low back pain/hip pain  Pain type: aching and burning Pain description: intermittent   Aggravating factors: no pattern Relieving factors: stretches  PRECAUTIONS: None  RED FLAGS: None   WEIGHT BEARING RESTRICTIONS: No  FALLS:  Has  patient fallen in last 6 months? No  LIVING ENVIRONMENT: Lives with: lives with their family Lives in: House/apartment  OCCUPATION: retired  PLOF: Independent  PATIENT GOALS: strengthen pelvic floor  PERTINENT HISTORY:  L4-S1 degenerative disc, stenosis L5-S1  BOWEL MOVEMENT: Pain with bowel movement: No Type of bowel movement:Frequency every day to every other day and Strain Yes Fully empty rectum: No Leakage: Yes: when she is having IBS flare Pads: No Fiber supplement: No - no sugar wheat or dairy   URINATION: Pain with urination: No Fully empty bladder: Yes: sometimes will leak when she stands up Stream: Strong Urgency: Yes: will have urgency, rarely leaks  Frequency: 2x/hour Leakage: Urge to void and Walking to the bathroom Pads: No  INTERCOURSE: Not sexually active, no history of pain  PREGNANCY: Vaginal deliveries 3 Tearing Yes: with first  PROLAPSE: Cystocele feels bulge in vagina   OBJECTIVE:  02/26/23:  COGNITION: Overall cognitive status: Within functional limits for tasks assessed     SENSATION: Light touch: Appears intact Proprioception: Appears intact  MUSCLE LENGTH:   FUNCTIONAL TESTS:    GAIT: Comments: WNL  POSTURE: rounded shoulders, forward head, decreased lumbar lordosis, and posterior pelvic tilt   PALPATION:   General  generalized abdominal restriction, no tenderness                External Perineal Exam some redness and white areas between labia; labial  fusion                             Internal Pelvic Floor WNL  Patient confirms identification and approves PT to assess internal pelvic floor and treatment Yes  PELVIC MMT:   MMT eval  Vaginal 0/5, 1/5 with training, 3 second hold, 5 repeat contraction  Diastasis Recti 1 finger width  (Blank rows = not tested)        TONE: WNL  PROLAPSE: Grade 2 anterior vaginal wall laxity  TODAY'S TREATMENT:                                                                                                                              DATE:  03/24/23 Manual: Abdominal soft tissue mobilization Bowel mobilization Neuromuscular re-education: Pelvic floor muscle contraction in inverted lying position  Exercises: Lower trunk rotation 2 x 10 Open books 10x bil Cat cow 2 x 10 Therapeutic activities: Inverted lying for vaginal bulge management - especially at night - good position to perform some contractions in Using finger for feedback in vagina to help improve coordination of pelvic floor muscle  Peri bottle and wet wipes for cleansing   02/26/23  EVAL  Neuromuscular re-education: Pelvic floor contraction training: Quick flicks Long holds Therapeutic activities: Squatty potty/relaxed toilet mechanics Bowel massage Urge drill    PATIENT EDUCATION:  Education details: See above Person educated: Patient Education method: Explanation, Demonstration, Tactile cues, Verbal cues, and Handouts Education comprehension: verbalized understanding  HOME EXERCISE PROGRAM: [id number]  ASSESSMENT:  CLINICAL IMPRESSION: Pt is having a hard time with bowel movements and incomplete emptying. She reports a lot of irritation surrounding anus from frequent bowel movements. Believe starting psyllium husk will be helpful. She was encouraged to increase water intake with this and continue using squatty potty. We performed bowel mobilization and abdominal soft tissue mobilization today with excellent  tolerance and increase in gut sounds. She tolerated all mobility well with no increase in discomfort. She will continue to benefit form skilled PT intervention in order to strengthen pelvic floor, improve pressure management, and begin/progress functional strengthening program.   OBJECTIVE IMPAIRMENTS: decreased activity tolerance, decreased coordination, decreased endurance, decreased strength, increased fascial restrictions, increased muscle spasms, postural dysfunction, and pain.   ACTIVITY LIMITATIONS: continence  PARTICIPATION LIMITATIONS: community activity  PERSONAL FACTORS: 1 comorbidity: medical history  are also affecting patient's functional outcome.   REHAB POTENTIAL: Good  CLINICAL DECISION MAKING: Stable/uncomplicated  EVALUATION COMPLEXITY: Low   GOALS: Goals reviewed with patient? Yes  SHORT TERM GOALS: Target date: 03/26/23  Pt will be independent with HEP.   Baseline: Goal status: INITIAL  2.  Pt will be independent with the knack, urge suppression technique, and double voiding in order to improve bladder habits and decrease urinary incontinence.   Baseline:  Goal status: INITIAL  3.  Pt will be independent with use of squatty potty, relaxed toileting mechanics, and improved bowel movement techniques in order to increase ease of bowel movements and complete evacuation.   Baseline:  Goal status: INITIAL  4.  Pt will be able to correctly perform diaphragmatic breathing and appropriate pressure management in order to prevent worsening vaginal wall laxity and improve pelvic floor A/ROM.   Baseline:  Goal status: INITIAL   LONG TERM GOALS: Target date: 04/23/23  Pt will be independent with advanced HEP.   Baseline:  Goal status: INITIAL  2.  Pt will demonstrate normal pelvic floor muscle tone and A/ROM, able to achieve 4/5 strength with contractions and 10 sec endurance, in order to provide appropriate lumbopelvic support in functional activities.    Baseline:  Goal status: INITIAL  3.  Pt will demonstrate appropriate pressure management and breathing with squatting/carrying 15lb KB.  Baseline:  Goal status: INITIAL  4.  Pt will be able to go 2-3 hours in between voids without urgency or incontinence in order to improve QOL and perform all functional activities with less difficulty.   Baseline:  Goal status: INITIAL   PLAN:  PT FREQUENCY: 1-2x/week  PT DURATION: 8 weeks  PLANNED INTERVENTIONS: Therapeutic exercises, Therapeutic activity, Neuromuscular re-education, Balance training, Gait training, Patient/Family education, Self Care, Joint mobilization, Dry Needling, Biofeedback, and Manual therapy  PLAN FOR NEXT SESSION: begin core training; double voiding   Julio Alm, PT, DPT10/08/243:28 PM

## 2023-04-07 ENCOUNTER — Ambulatory Visit: Payer: PPO

## 2023-04-07 DIAGNOSIS — M5459 Other low back pain: Secondary | ICD-10-CM

## 2023-04-07 DIAGNOSIS — M6281 Muscle weakness (generalized): Secondary | ICD-10-CM | POA: Diagnosis not present

## 2023-04-07 DIAGNOSIS — R279 Unspecified lack of coordination: Secondary | ICD-10-CM

## 2023-04-07 NOTE — Therapy (Signed)
OUTPATIENT PHYSICAL THERAPY FEMALE PELVIC EVALUATION   Patient Name: Olivia Morrison MRN: [id number] DOB:Jun 04, 1951, 72 y.o., female Today's Date: 04/07/2023  END OF SESSION:  PT End of Session - 04/07/23 0802     Visit Number 3    Date for PT Re-Evaluation 04/23/23    Authorization Type HTA    Progress Note Due on Visit 10    PT Start Time 0800    PT Stop Time 0840    PT Time Calculation (min) 40 min    Activity Tolerance Patient tolerated treatment well    Behavior During Therapy [facility] for tasks assessed/performed              Past Medical History:  Diagnosis Date   Abnormal stress test    2016, saw cards, nuclear scan planned   Depression    seeing [name] - psychologist   GERD (gastroesophageal reflux disease)    Melanoma (HCC)    in 2016, removed; seeing Dr. Nicholas Lose   Past Surgical History:  Procedure Laterality Date   ABDOMINAL HYSTERECTOMY  1993   leiomyomata, bleeding   BREAST BIOPSY Right 2015   Eye lid surg     Melanoma excised     TONSILLECTOMY  1959   Patient Active Problem List   Diagnosis Date Noted   Hypothyroidism 07/22/2017   Menopausal hot flushes 07/22/2017   Snoring 08/07/2015   Rhinitis, chronic 08/07/2015   Bereavement 05/07/2015   Melanoma of skin (HCC) 05/07/2015   Abnormal ECG during exercise stress test 05/01/2015   Hyperlipidemia 05/01/2015    PCP: Lewis Moccasin, MD  REFERRING PROVIDER: Zelphia Cairo, MD   REFERRING DIAG: N81.10 (ICD-10-CM) - Cystocele, unspecified  THERAPY DIAG:  Muscle weakness (generalized)  Unspecified lack of coordination  Other low back pain  Rationale for Evaluation and Treatment: Rehabilitation  ONSET DATE: 2 weeks   SUBJECTIVE:                                                                                                                                                                                           SUBJECTIVE STATEMENT: Pt started ultraviolet oxygen infusions and feels  like they are helping. She feels like she is not having as much vaginal bulging. She feels like bowel movements are getting more regular. She states that her sister has been in town and she has not worked on [employer].   PAIN:  Are you having pain? Yes NPRS scale: 3/10 Pain location:  lower abdominal cramping/bloating; low back pain/hip pain  Pain type: aching and burning Pain description: intermittent   Aggravating factors: no pattern Relieving factors: stretches  PRECAUTIONS: None  RED FLAGS: None   WEIGHT BEARING RESTRICTIONS: No  FALLS:  Has patient fallen in last 6 months? No  LIVING ENVIRONMENT: Lives with: lives with their family Lives in: House/apartment  OCCUPATION: retired  PLOF: Independent  PATIENT GOALS: strengthen pelvic floor  PERTINENT HISTORY:  L4-S1 degenerative disc, stenosis L5-S1  BOWEL MOVEMENT: Pain with bowel movement: No Type of bowel movement:Frequency every day to every other day and Strain Yes Fully empty rectum: No Leakage: Yes: when she is having IBS flare Pads: No Fiber supplement: No - no sugar wheat or dairy   URINATION: Pain with urination: No Fully empty bladder: Yes: sometimes will leak when she stands up Stream: Strong Urgency: Yes: will have urgency, rarely leaks  Frequency: 2x/hour Leakage: Urge to void and Walking to the bathroom Pads: No  INTERCOURSE: Not sexually active, no history of pain  PREGNANCY: Vaginal deliveries 3 Tearing Yes: with first  PROLAPSE: Cystocele feels bulge in vagina   OBJECTIVE:  02/26/23:  COGNITION: Overall cognitive status: Within functional limits for tasks assessed     SENSATION: Light touch: Appears intact Proprioception: Appears intact  MUSCLE LENGTH:   FUNCTIONAL TESTS:    GAIT: Comments: WNL  POSTURE: rounded shoulders, forward head, decreased lumbar lordosis, and posterior pelvic tilt   PALPATION:   General  generalized abdominal restriction, no tenderness                 External Perineal Exam some redness and white areas between labia; labial fusion                             Internal Pelvic Floor WNL  Patient confirms identification and approves PT to assess internal pelvic floor and treatment Yes  PELVIC MMT:   MMT eval  Vaginal 0/5, 1/5 with training, 3 second hold, 5 repeat contraction  Diastasis Recti 1 finger width  (Blank rows = not tested)        TONE: WNL  PROLAPSE: Grade 2 anterior vaginal wall laxity  TODAY'S TREATMENT:                                                                                                                              DATE:  04/07/23 Neuromuscular re-education: Transversus abdominus training with multimodal cues for improved motor control and breath coordination Bil UE ball press 10x with transversus abdominus and pelvic floor muscle Bil hip adduction 10x with transversus abdominus and pelvic floor muscle contractions  Resisted march in supine with diagonal press 10x bil with cues for pelvic floor muscle and transversus abdominus  Exercises: Lower trunk rotation 2 x 10 Cat cow 2 x 10  03/24/23 Manual: Abdominal soft tissue mobilization Bowel mobilization Neuromuscular re-education: Pelvic floor muscle contraction in inverted lying position  Exercises: Lower trunk rotation 2 x 10 Open books 10x bil Cat cow 2 x 10 Therapeutic activities: Inverted lying for vaginal bulge management - especially  at night - good position to perform some contractions in Using finger for feedback in vagina to help improve coordination of pelvic floor muscle  Peri bottle and wet wipes for cleansing   02/26/23  EVAL  Neuromuscular re-education: Pelvic floor contraction training: Quick flicks Long holds Therapeutic activities: Squatty potty/relaxed toilet mechanics Bowel massage Urge drill    PATIENT EDUCATION:  Education details: See above Person educated: Patient Education method: Explanation,  Demonstration, Tactile cues, Verbal cues, and Handouts Education comprehension: verbalized understanding  HOME EXERCISE PROGRAM: [id number]  ASSESSMENT:  CLINICAL IMPRESSION: Pt doing much better with pain and more regular bowel movements. She was able to review mobility activities since she has not been able to perform in the last week. She was able to begin core training, but had notable decrease in activation on Rt compared to Lt. With her own manual feedback and multimodal cues, she was able to get more equal contractions bil. HEP updated. She will continue to benefit form skilled PT intervention in order to strengthen pelvic floor, improve pressure management, and begin/progress functional strengthening program.   OBJECTIVE IMPAIRMENTS: decreased activity tolerance, decreased coordination, decreased endurance, decreased strength, increased fascial restrictions, increased muscle spasms, postural dysfunction, and pain.   ACTIVITY LIMITATIONS: continence  PARTICIPATION LIMITATIONS: community activity  PERSONAL FACTORS: 1 comorbidity: medical history  are also affecting patient's functional outcome.   REHAB POTENTIAL: Good  CLINICAL DECISION MAKING: Stable/uncomplicated  EVALUATION COMPLEXITY: Low   GOALS: Goals reviewed with patient? Yes  SHORT TERM GOALS: Target date: 03/26/23 - updated 04/07/23  Pt will be independent with HEP.   Baseline: Goal status: MET 04/07/23  2.  Pt will be independent with the knack, urge suppression technique, and double voiding in order to improve bladder habits and decrease urinary incontinence.   Baseline:  Goal status: IN PROGRESS  3.  Pt will be independent with use of squatty potty, relaxed toileting mechanics, and improved bowel movement techniques in order to increase ease of bowel movements and complete evacuation.   Baseline:  Goal status: IN PROGRESS  4.  Pt will be able to correctly perform diaphragmatic breathing and appropriate  pressure management in order to prevent worsening vaginal wall laxity and improve pelvic floor A/ROM.   Baseline:  Goal status: IN PROGRESS   LONG TERM GOALS: Target date: 04/23/23 - updated 04/07/23  Pt will be independent with advanced HEP.   Baseline:  Goal status: IN PROGRESS  2.  Pt will demonstrate normal pelvic floor muscle tone and A/ROM, able to achieve 4/5 strength with contractions and 10 sec endurance, in order to provide appropriate lumbopelvic support in functional activities.   Baseline:  Goal status: IN PROGRESS  3.  Pt will demonstrate appropriate pressure management and breathing with squatting/carrying 15lb KB.  Baseline:  Goal status: IN PROGRESS  4.  Pt will be able to go 2-3 hours in between voids without urgency or incontinence in order to improve QOL and perform all functional activities with less difficulty.   Baseline:  Goal status: IN PROGRESS   PLAN:  PT FREQUENCY: 1-2x/week  PT DURATION: 8 weeks  PLANNED INTERVENTIONS: Therapeutic exercises, Therapeutic activity, Neuromuscular re-education, Balance training, Gait training, Patient/Family education, Self Care, Joint mobilization, Dry Needling, Biofeedback, and Manual therapy  PLAN FOR NEXT SESSION: progress core training; double voiding   Julio Alm, PT, DPT10/22/248:46 AM

## 2023-04-11 DIAGNOSIS — G4733 Obstructive sleep apnea (adult) (pediatric): Secondary | ICD-10-CM | POA: Diagnosis not present

## 2023-04-14 DIAGNOSIS — K921 Melena: Secondary | ICD-10-CM | POA: Diagnosis not present

## 2023-04-20 DIAGNOSIS — D550 Anemia due to glucose-6-phosphate dehydrogenase [G6PD] deficiency: Secondary | ICD-10-CM | POA: Diagnosis not present

## 2023-04-20 DIAGNOSIS — D559 Anemia due to enzyme disorder, unspecified: Secondary | ICD-10-CM | POA: Diagnosis not present

## 2023-04-20 DIAGNOSIS — D649 Anemia, unspecified: Secondary | ICD-10-CM | POA: Diagnosis not present

## 2023-04-20 DIAGNOSIS — D509 Iron deficiency anemia, unspecified: Secondary | ICD-10-CM | POA: Diagnosis not present

## 2023-05-05 ENCOUNTER — Other Ambulatory Visit: Payer: Self-pay | Admitting: Physical Medicine and Rehabilitation

## 2023-05-05 ENCOUNTER — Ambulatory Visit
Admission: RE | Admit: 2023-05-05 | Discharge: 2023-05-05 | Disposition: A | Discharge status: Home / Self Care | Payer: PPO | Source: Ambulatory Visit | Attending: Physical Medicine and Rehabilitation | Admitting: Physical Medicine and Rehabilitation

## 2023-05-05 ENCOUNTER — Encounter: Payer: Self-pay | Admitting: Physical Medicine and Rehabilitation

## 2023-05-05 DIAGNOSIS — M25572 Pain in left ankle and joints of left foot: Secondary | ICD-10-CM | POA: Diagnosis not present

## 2023-05-05 DIAGNOSIS — M79672 Pain in left foot: Secondary | ICD-10-CM

## 2023-05-05 DIAGNOSIS — M79671 Pain in right foot: Secondary | ICD-10-CM

## 2023-05-05 DIAGNOSIS — M25571 Pain in right ankle and joints of right foot: Secondary | ICD-10-CM | POA: Diagnosis not present

## 2023-05-12 DIAGNOSIS — G4733 Obstructive sleep apnea (adult) (pediatric): Secondary | ICD-10-CM | POA: Diagnosis not present

## 2023-05-21 ENCOUNTER — Ambulatory Visit: Payer: PPO | Attending: Obstetrics and Gynecology

## 2023-05-21 DIAGNOSIS — R279 Unspecified lack of coordination: Secondary | ICD-10-CM | POA: Insufficient documentation

## 2023-05-21 DIAGNOSIS — M6281 Muscle weakness (generalized): Secondary | ICD-10-CM | POA: Insufficient documentation

## 2023-05-21 DIAGNOSIS — H04123 Dry eye syndrome of bilateral lacrimal glands: Secondary | ICD-10-CM | POA: Diagnosis not present

## 2023-05-21 DIAGNOSIS — H5203 Hypermetropia, bilateral: Secondary | ICD-10-CM | POA: Diagnosis not present

## 2023-05-21 DIAGNOSIS — H53143 Visual discomfort, bilateral: Secondary | ICD-10-CM | POA: Diagnosis not present

## 2023-05-21 DIAGNOSIS — H524 Presbyopia: Secondary | ICD-10-CM | POA: Diagnosis not present

## 2023-05-21 DIAGNOSIS — N8110 Cystocele, unspecified: Secondary | ICD-10-CM | POA: Insufficient documentation

## 2023-05-21 DIAGNOSIS — H52223 Regular astigmatism, bilateral: Secondary | ICD-10-CM | POA: Diagnosis not present

## 2023-05-21 DIAGNOSIS — M5459 Other low back pain: Secondary | ICD-10-CM | POA: Diagnosis not present

## 2023-05-21 NOTE — Therapy (Signed)
OUTPATIENT PHYSICAL THERAPY FEMALE PELVIC EVALUATION   Patient Name: Olivia Morrison MRN: [id number] DOB:03-Mar-1951, 72 y.o., female Today's Date: 05/21/2023  END OF SESSION:  PT End of Session - 05/21/23 1234     Visit Number 4    Date for PT Re-Evaluation 07/16/23    Authorization Type HTA    Progress Note Due on Visit 10    PT Start Time 1230    PT Stop Time 1310    PT Time Calculation (min) 40 min    Activity Tolerance Patient tolerated treatment well    Behavior During Therapy [facility] for tasks assessed/performed              Past Medical History:  Diagnosis Date   Abnormal stress test    2016, saw cards, nuclear scan planned   Depression    seeing [name] - psychologist   GERD (gastroesophageal reflux disease)    Melanoma (HCC)    in 2016, removed; seeing Dr. Nicholas Lose   Past Surgical History:  Procedure Laterality Date   ABDOMINAL HYSTERECTOMY  1993   leiomyomata, bleeding   BREAST BIOPSY Right 2015   Eye lid surg     Melanoma excised     TONSILLECTOMY  1959   Patient Active Problem List   Diagnosis Date Noted   Hypothyroidism 07/22/2017   Menopausal hot flushes 07/22/2017   Snoring 08/07/2015   Rhinitis, chronic 08/07/2015   Bereavement 05/07/2015   Melanoma of skin (HCC) 05/07/2015   Abnormal ECG during exercise stress test 05/01/2015   Hyperlipidemia 05/01/2015    PCP: Lewis Moccasin, MD  REFERRING PROVIDER: Zelphia Cairo, MD   REFERRING DIAG: N81.10 (ICD-10-CM) - Cystocele, unspecified  THERAPY DIAG:  Muscle weakness (generalized)  Unspecified lack of coordination  Other low back pain  Rationale for Evaluation and Treatment: Rehabilitation  ONSET DATE: 2 weeks   SUBJECTIVE:                                                                                                                                                                                           SUBJECTIVE STATEMENT: Pt states that she was more consistent with  exercises in the beginning, and now she is not being as good. She feels like the bulge has gotten worse, but IBS is getting better after O3 infusions. She has been picking up 56 year old grand child and she can feel increased vaginal pressure when doing this.   PAIN:  Are you having pain? Yes NPRS scale: 3/10 Pain location:  lower abdominal cramping/bloating; low back pain/hip pain  Pain type: aching and burning Pain description: intermittent   Aggravating  factors: no pattern Relieving factors: stretches  PRECAUTIONS: None  RED FLAGS: None   WEIGHT BEARING RESTRICTIONS: No  FALLS:  Has patient fallen in last 6 months? No  LIVING ENVIRONMENT: Lives with: lives with their family Lives in: House/apartment  OCCUPATION: retired  PLOF: Independent  PATIENT GOALS: strengthen pelvic floor  PERTINENT HISTORY:  L4-S1 degenerative disc, stenosis L5-S1  BOWEL MOVEMENT: Pain with bowel movement: No Type of bowel movement:Frequency every day to every other day and Strain Yes Fully empty rectum: No Leakage: Yes: when she is having IBS flare Pads: No Fiber supplement: No - no sugar wheat or dairy   URINATION: Pain with urination: No Fully empty bladder: Yes: sometimes will leak when she stands up Stream: Strong Urgency: Yes: will have urgency, rarely leaks  Frequency: 2x/hour Leakage: Urge to void and Walking to the bathroom Pads: No  INTERCOURSE: Not sexually active, no history of pain  PREGNANCY: Vaginal deliveries 3 Tearing Yes: with first  PROLAPSE: Cystocele feels bulge in vagina   OBJECTIVE:  05/21/23: Core activation: able to contract transversus abdominus with multimodal cues; she reports being able to feel pelvic floor muscle contraction here No internal pelvic floor muscle exam performed this session, but improvement in patient proprioception when performing with transversus abdominus    02/26/23:  COGNITION: Overall cognitive status: Within functional  limits for tasks assessed     SENSATION: Light touch: Appears intact Proprioception: Appears intact  GAIT: Comments: WNL  POSTURE: rounded shoulders, forward head, decreased lumbar lordosis, and posterior pelvic tilt   PALPATION:   General  generalized abdominal restriction, no tenderness                External Perineal Exam some redness and white areas between labia; labial fusion                             Internal Pelvic Floor WNL  Patient confirms identification and approves PT to assess internal pelvic floor and treatment Yes  PELVIC MMT:   MMT eval  Vaginal 0/5, 1/5 with training, 3 second hold, 5 repeat contraction  Diastasis Recti 1 finger width  (Blank rows = not tested)        TONE: WNL  PROLAPSE: Grade 2 anterior vaginal wall laxity  TODAY'S TREATMENT:                                                                                                                              DATE:  05/21/23 RE-EVAL Neuromuscular re-education: Transversus abdominus training with multimodal cues for improved motor control and breath coordination Transversus abdominus isometrics 10x with pelvic floor muscle contraction Bil UE ball press 10x with transversus abdominus and pelvic floor muscle Bil hip adduction 10x with transversus abdominus and pelvic floor muscle contractions  Resisted march in supine with diagonal press 10x bil with cues for pelvic floor muscle and transversus abdominus  Exercises: Single knee  to chest 10x bil  Therapeutic activities: Reviewed pelvic floor muscle contraction program Reviewed inverted lying position and performing at the end of each    04/07/23 Neuromuscular re-education: Transversus abdominus training with multimodal cues for improved motor control and breath coordination Bil UE ball press 10x with transversus abdominus and pelvic floor muscle Bil hip adduction 10x with transversus abdominus and pelvic floor muscle contractions   Resisted march in supine with diagonal press 10x bil with cues for pelvic floor muscle and transversus abdominus  Exercises: Lower trunk rotation 2 x 10 Cat cow 2 x 10  03/24/23 Manual: Abdominal soft tissue mobilization Bowel mobilization Neuromuscular re-education: Pelvic floor muscle contraction in inverted lying position  Exercises: Lower trunk rotation 2 x 10 Open books 10x bil Cat cow 2 x 10 Therapeutic activities: Inverted lying for vaginal bulge management - especially at night - good position to perform some contractions in Using finger for feedback in vagina to help improve coordination of pelvic floor muscle  Peri bottle and wet wipes for cleansing   PATIENT EDUCATION:  Education details: See above Person educated: Patient Education method: Explanation, Demonstration, Tactile cues, Verbal cues, and Handouts Education comprehension: verbalized understanding  HOME EXERCISE PROGRAM: [id number]  ASSESSMENT:  CLINICAL IMPRESSION: Pt has not been able to consistently work on exercises recently and feels like she has had an increase in symptoms due to this. We reviewed how pelvic floor physical therapy may help with prolapse symptoms. She is still able to achieve good core activation with appropriate pressure management, but believe that she needs to increase overall core/hip strength and coordinate this and pressure management into functional activities in order to see progress and prevent prolapse from getting worse. She has seen good improvements in bladder control/urgency. She will continue to benefit form skilled PT intervention in order to strengthen pelvic floor, improve pressure management, and begin/progress functional strengthening program.   OBJECTIVE IMPAIRMENTS: decreased activity tolerance, decreased coordination, decreased endurance, decreased strength, increased fascial restrictions, increased muscle spasms, postural dysfunction, and pain.   ACTIVITY  LIMITATIONS: continence  PARTICIPATION LIMITATIONS: community activity  PERSONAL FACTORS: 1 comorbidity: medical history  are also affecting patient's functional outcome.   REHAB POTENTIAL: Good  CLINICAL DECISION MAKING: Stable/uncomplicated  EVALUATION COMPLEXITY: Low   GOALS: Goals reviewed with patient? Yes  SHORT TERM GOALS: Target date: 03/26/23 - updated 04/07/23 - updated 05/21/23  Pt will be independent with HEP.   Baseline: Goal status: MET 04/07/23  2.  Pt will be independent with the knack, urge suppression technique, and double voiding in order to improve bladder habits and decrease urinary incontinence.   Baseline:  Goal status: MET 05/21/23  3.  Pt will be independent with use of squatty potty, relaxed toileting mechanics, and improved bowel movement techniques in order to increase ease of bowel movements and complete evacuation.   Baseline:  Goal status: MET 05/21/23  4.  Pt will be able to correctly perform diaphragmatic breathing and appropriate pressure management in order to prevent worsening vaginal wall laxity and improve pelvic floor A/ROM.   Baseline:  Goal status: MET 05/21/23   LONG TERM GOALS: Target date: 04/23/23 - updated 04/07/23 - 05/21/23  Pt will be independent with advanced HEP.   Baseline:  Goal status: IN PROGRESS 05/21/23  2.  Pt will demonstrate normal pelvic floor muscle tone and A/ROM, able to achieve 4/5 strength with contractions and 10 sec endurance, in order to provide appropriate lumbopelvic support in functional activities.   Baseline:  Goal  status: IN PROGRESS 05/21/23  3.  Pt will demonstrate appropriate pressure management and breathing with squatting/carrying 15lb KB.  Baseline:  Goal status: IN PROGRESS 05/21/23  4.  Pt will be able to go 2-3 hours in between voids without urgency or incontinence in order to improve QOL and perform all functional activities with less difficulty.   Baseline:  Goal status:MET  05/21/23   PLAN:  PT FREQUENCY: 1-2x/week  PT DURATION: 8 weeks  PLANNED INTERVENTIONS: Therapeutic exercises, Therapeutic activity, Neuromuscular re-education, Balance training, Gait training, Patient/Family education, Self Care, Joint mobilization, Dry Needling, Biofeedback, and Manual therapy  PLAN FOR NEXT SESSION: progress core training; double voiding   Julio Alm, PT, DPT12/05/241:08 PM

## 2023-05-28 ENCOUNTER — Ambulatory Visit: Payer: PPO

## 2023-05-28 DIAGNOSIS — M6281 Muscle weakness (generalized): Secondary | ICD-10-CM

## 2023-05-28 DIAGNOSIS — R279 Unspecified lack of coordination: Secondary | ICD-10-CM

## 2023-05-28 DIAGNOSIS — M5459 Other low back pain: Secondary | ICD-10-CM

## 2023-05-28 DIAGNOSIS — N8110 Cystocele, unspecified: Secondary | ICD-10-CM | POA: Diagnosis not present

## 2023-05-28 NOTE — Therapy (Signed)
OUTPATIENT PHYSICAL THERAPY FEMALE PELVIC EVALUATION   Patient Name: Olivia Morrison MRN: [id number] DOB:[birth year], 72 y.o., female Today's Date: 05/28/2023  END OF SESSION:  PT End of Session - 05/28/23 1233     Visit Number 5    Date for PT Re-Evaluation 07/16/23    Authorization Type HTA    Progress Note Due on Visit 10    PT Start Time 1230    PT Stop Time 1310    PT Time Calculation (min) 40 min    Activity Tolerance Patient tolerated treatment well    Behavior During Therapy [facility] for tasks assessed/performed              Past Medical History:  Diagnosis Date   Abnormal stress test    2016, saw cards, nuclear scan planned   Depression    seeing [name] - psychologist   GERD (gastroesophageal reflux disease)    Melanoma (HCC)    in 2016, removed; seeing Dr. Nicholas Lose   Past Surgical History:  Procedure Laterality Date   ABDOMINAL HYSTERECTOMY  1993   leiomyomata, bleeding   BREAST BIOPSY Right 2015   Eye lid surg     Melanoma excised     TONSILLECTOMY  1959   Patient Active Problem List   Diagnosis Date Noted   Hypothyroidism 07/22/2017   Menopausal hot flushes 07/22/2017   Snoring 08/07/2015   Rhinitis, chronic 08/07/2015   Bereavement 05/07/2015   Melanoma of skin (HCC) 05/07/2015   Abnormal ECG during exercise stress test 05/01/2015   Hyperlipidemia 05/01/2015    PCP: Lewis Moccasin, MD  REFERRING PROVIDER: Zelphia Cairo, MD   REFERRING DIAG: N81.10 (ICD-10-CM) - Cystocele, unspecified  THERAPY DIAG:  Muscle weakness (generalized)  Unspecified lack of coordination  Other low back pain  Rationale for Evaluation and Treatment: Rehabilitation  ONSET DATE: 2 weeks   SUBJECTIVE:                                                                                                                                                                                           SUBJECTIVE STATEMENT: Pt states that she was more consistent with  exercises in the beginning, and now she is not being as good. She feels like the bulge has gotten worse, but IBS is getting better after O3 infusions. She has been picking up 24 year old grand child and she can feel increased vaginal pressure when doing this.   PAIN:  Are you having pain? Yes NPRS scale: 3/10 Pain location:  lower abdominal cramping/bloating; low back pain/hip pain  Pain type: aching and burning Pain description: intermittent   Aggravating  factors: no pattern Relieving factors: stretches  PRECAUTIONS: None  RED FLAGS: None   WEIGHT BEARING RESTRICTIONS: No  FALLS:  Has patient fallen in last 6 months? No  LIVING ENVIRONMENT: Lives with: lives with their family Lives in: House/apartment  OCCUPATION: retired  PLOF: Independent  PATIENT GOALS: strengthen pelvic floor  PERTINENT HISTORY:  L4-S1 degenerative disc, stenosis L5-S1  BOWEL MOVEMENT: Pain with bowel movement: No Type of bowel movement:Frequency every day to every other day and Strain Yes Fully empty rectum: No Leakage: Yes: when she is having IBS flare Pads: No Fiber supplement: No - no sugar wheat or dairy   URINATION: Pain with urination: No Fully empty bladder: Yes: sometimes will leak when she stands up Stream: Strong Urgency: Yes: will have urgency, rarely leaks  Frequency: 2x/hour Leakage: Urge to void and Walking to the bathroom Pads: No  INTERCOURSE: Not sexually active, no history of pain  PREGNANCY: Vaginal deliveries 3 Tearing Yes: with first  PROLAPSE: Cystocele feels bulge in vagina   OBJECTIVE:  05/21/23: Core activation: able to contract transversus abdominus with multimodal cues; she reports being able to feel pelvic floor muscle contraction here No internal pelvic floor muscle exam performed this session, but improvement in patient proprioception when performing with transversus abdominus    02/26/23:  COGNITION: Overall cognitive status: Within functional  limits for tasks assessed     SENSATION: Light touch: Appears intact Proprioception: Appears intact  GAIT: Comments: WNL  POSTURE: rounded shoulders, forward head, decreased lumbar lordosis, and posterior pelvic tilt   PALPATION:   General  generalized abdominal restriction, no tenderness                External Perineal Exam some redness and white areas between labia; labial fusion                             Internal Pelvic Floor WNL  Patient confirms identification and approves PT to assess internal pelvic floor and treatment Yes  PELVIC MMT:   MMT eval  Vaginal 0/5, 1/5 with training, 3 second hold, 5 repeat contraction  Diastasis Recti 1 finger width  (Blank rows = not tested)        TONE: WNL  PROLAPSE: Grade 2 anterior vaginal wall laxity  TODAY'S TREATMENT:                                                                                                                              DATE:  05/28/23 Neuromuscular re-education: Seated hip adduction ball press with transversus abdominus and pelvic floor muscle 2 x 10 Seated hip abduction red band with transversus abdominus and pelvic floor muscle 2 x 10 Seated resisted march red band with transversus abdominus and pelvic floor muscle 2 x 10 Exercises: Seated thoracic extensions over foam roller 10x Wall clock with red band 5 rounds bil  Therapeutic activities: Squat with red band around thighs  2 x 10 to table  3-way kick 10x each, bil   05/21/23 RE-EVAL Neuromuscular re-education: Transversus abdominus training with multimodal cues for improved motor control and breath coordination Transversus abdominus isometrics 10x with pelvic floor muscle contraction Bil UE ball press 10x with transversus abdominus and pelvic floor muscle Bil hip adduction 10x with transversus abdominus and pelvic floor muscle contractions  Resisted march in supine with diagonal press 10x bil with cues for pelvic floor muscle and  transversus abdominus  Exercises: Single knee to chest 10x bil  Therapeutic activities: Reviewed pelvic floor muscle contraction program Reviewed inverted lying position and performing at the end of each    04/07/23 Neuromuscular re-education: Transversus abdominus training with multimodal cues for improved motor control and breath coordination Bil UE ball press 10x with transversus abdominus and pelvic floor muscle Bil hip adduction 10x with transversus abdominus and pelvic floor muscle contractions  Resisted march in supine with diagonal press 10x bil with cues for pelvic floor muscle and transversus abdominus  Exercises: Lower trunk rotation 2 x 10 Cat cow 2 x 10  PATIENT EDUCATION:  Education details: See above Person educated: Patient Education method: Explanation, Demonstration, Tactile cues, Verbal cues, and Handouts Education comprehension: verbalized understanding  HOME EXERCISE PROGRAM: [id number]  ASSESSMENT:  CLINICAL IMPRESSION: Pt doing very well with only mild soreness after last session. She was able to progress into seated and standing positions this session, doing very well with breathing and core facilitation. She has noticed that she has had more difficulty maintaining good posture recently; we discussed how forward folded posture can impact good pressure management. She tolerated all exercises very well. Therefore, we performed some thoracic mobility and upper back strengthening. She did have Lt hip pain with 3-way kick; due to this we discussed that dry needling may be helpful. It is related to pelvic floor muscle because if these muscles are not firing correctly, they may be preventing pelvic floor muscle from working correctly as well. She will continue to benefit form skilled PT intervention in order to strengthen pelvic floor, improve pressure management, and begin/progress functional strengthening program.   OBJECTIVE IMPAIRMENTS: decreased activity tolerance,  decreased coordination, decreased endurance, decreased strength, increased fascial restrictions, increased muscle spasms, postural dysfunction, and pain.   ACTIVITY LIMITATIONS: continence  PARTICIPATION LIMITATIONS: community activity  PERSONAL FACTORS: 1 comorbidity: medical history  are also affecting patient's functional outcome.   REHAB POTENTIAL: Good  CLINICAL DECISION MAKING: Stable/uncomplicated  EVALUATION COMPLEXITY: Low   GOALS: Goals reviewed with patient? Yes  SHORT TERM GOALS: Target date: 03/26/23 - updated 04/07/23 - updated 05/21/23  Pt will be independent with HEP.   Baseline: Goal status: MET 04/07/23  2.  Pt will be independent with the knack, urge suppression technique, and double voiding in order to improve bladder habits and decrease urinary incontinence.   Baseline:  Goal status: MET 05/21/23  3.  Pt will be independent with use of squatty potty, relaxed toileting mechanics, and improved bowel movement techniques in order to increase ease of bowel movements and complete evacuation.   Baseline:  Goal status: MET 05/21/23  4.  Pt will be able to correctly perform diaphragmatic breathing and appropriate pressure management in order to prevent worsening vaginal wall laxity and improve pelvic floor A/ROM.   Baseline:  Goal status: MET 05/21/23   LONG TERM GOALS: Target date: 04/23/23 - updated 04/07/23 - 05/21/23  Pt will be independent with advanced HEP.   Baseline:  Goal status: IN PROGRESS 05/21/23  2.  Pt will demonstrate normal pelvic floor muscle tone and A/ROM, able to achieve 4/5 strength with contractions and 10 sec endurance, in order to provide appropriate lumbopelvic support in functional activities.   Baseline:  Goal status: IN PROGRESS 05/21/23  3.  Pt will demonstrate appropriate pressure management and breathing with squatting/carrying 15lb KB.  Baseline:  Goal status: IN PROGRESS 05/21/23  4.  Pt will be able to go 2-3 hours in  between voids without urgency or incontinence in order to improve QOL and perform all functional activities with less difficulty.   Baseline:  Goal status:MET 05/21/23   PLAN:  PT FREQUENCY: 1-2x/week  PT DURATION: 8 weeks  PLANNED INTERVENTIONS: Therapeutic exercises, Therapeutic activity, Neuromuscular re-education, Balance training, Gait training, Patient/Family education, Self Care, Joint mobilization, Dry Needling, Biofeedback, and Manual therapy  PLAN FOR NEXT SESSION: progress core training; dry needling to Lt hip   Julio Alm, PT, DPT12/12/241:14 PM

## 2023-06-04 ENCOUNTER — Ambulatory Visit: Payer: PPO

## 2023-06-04 DIAGNOSIS — M5459 Other low back pain: Secondary | ICD-10-CM

## 2023-06-04 DIAGNOSIS — N8110 Cystocele, unspecified: Secondary | ICD-10-CM | POA: Diagnosis not present

## 2023-06-04 DIAGNOSIS — M6281 Muscle weakness (generalized): Secondary | ICD-10-CM

## 2023-06-04 DIAGNOSIS — R279 Unspecified lack of coordination: Secondary | ICD-10-CM

## 2023-06-04 NOTE — Patient Instructions (Signed)
Trigger Point Dry Needling  What is Trigger Point Dry Needling (DN)? DN is a physical therapy technique used to treat muscle pain and dysfunction. Specifically, DN helps deactivate muscle trigger points (muscle knots).  A thin filiform needle is used to penetrate the skin and stimulate the underlying trigger point. The goal is for a local twitch response (LTR) to occur and for the trigger point to relax. No medication of any kind is injected during the procedure.   What Does Trigger Point Dry Needling Feel Like?  The procedure feels different for each individual patient. Some patients report that they do not actually feel the needle enter the skin and overall the process is not painful. Very mild bleeding may occur. However, many patients feel a deep cramping in the muscle in which the needle was inserted. This is the local twitch response.   How Will I feel after the treatment? Soreness is normal, and the onset of soreness may not occur for a few hours. Typically this soreness does not last longer than two days.  Bruising is uncommon, however; ice can be used to decrease any possible bruising.  In rare cases feeling tired or nauseous after the treatment is normal. In addition, your symptoms may get worse before they get better, this period will typically not last longer than 24 hours.   What Can I do After My Treatment? Increase your hydration by drinking more water for the next 24 hours.  You may place ice or heat on the areas treated that have become sore, however, do not use heat on inflamed or bruised areas. Heat often brings more relief post needling. You can continue your regular activities, but vigorous activity is not recommended initially after the treatment for 24 hours. DN is best combined with other physical therapy such as strengthening, stretching, and other therapies.   What are the complications? While your therapist has had extensive training in minimizing the risks of trigger  point dry needling, it is important to understand the risks of any procedure.  Risks include bleeding, pain, fatigue, hematoma, infection, vertigo, nausea or nerve involvement. Monitor for any changes to your skin or sensation. Contact your therapist or MD with concerns.  A rare but serious complication is a pneumothorax over or near your middle and upper chest and back If you have dry needling in this area, monitor for the following symptoms: Shortness of breath on exertion and/or Difficulty taking a deep breath and/or Chest Pain and/or A dry cough If any of the above symptoms develop, please go to the nearest emergency room or call 911. Tell them you had dry needling over your thorax and report any symptoms you are having. Please follow-up with your treating therapist after you complete the medical evaluation.   [facility]  [address] [place] [address].  [phone]

## 2023-06-04 NOTE — Therapy (Addendum)
 OUTPATIENT PHYSICAL THERAPY FEMALE PELVIC EVALUATION   Patient Name: Olivia Morrison MRN: [id number] DOB:Jan 10, 1951, 72 y.o., female Today's Date: 06/04/2023  END OF SESSION:  PT End of Session - 06/04/23 1231     Visit Number 6    Date for PT Re-Evaluation 07/16/23    Authorization Type HTA    Progress Note Due on Visit 10    PT Start Time 1230    PT Stop Time 1310    PT Time Calculation (min) 40 min    Activity Tolerance Patient tolerated treatment well    Behavior During Therapy [facility] for tasks assessed/performed              Past Medical History:  Diagnosis Date   Abnormal stress test    2016, saw cards, nuclear scan planned   Depression    seeing [name] - psychologist   GERD (gastroesophageal reflux disease)    Melanoma (HCC)    in 2016, removed; seeing Dr. Cary   Past Surgical History:  Procedure Laterality Date   ABDOMINAL HYSTERECTOMY  1993   leiomyomata, bleeding   BREAST BIOPSY Right 2015   Eye lid surg     Melanoma excised     TONSILLECTOMY  1959   Patient Active Problem List   Diagnosis Date Noted   Hypothyroidism 07/22/2017   Menopausal hot flushes 07/22/2017   Snoring 08/07/2015   Rhinitis, chronic 08/07/2015   Bereavement 05/07/2015   Melanoma of skin (HCC) 05/07/2015   Abnormal ECG during exercise stress test 05/01/2015   Hyperlipidemia 05/01/2015    PCP: Waylan Almarie SAUNDERS, MD  REFERRING PROVIDER: Latisha Medford, MD   REFERRING DIAG: N81.10 (ICD-10-CM) - Cystocele, unspecified  THERAPY DIAG:  Muscle weakness (generalized)  Unspecified lack of coordination  Other low back pain  Rationale for Evaluation and Treatment: Rehabilitation  ONSET DATE: 2 weeks   SUBJECTIVE:                                                                                                                                                                                           SUBJECTIVE STATEMENT: Pt started having some lowe abdominal discomfort at  the beginning of the week that also caused some urinary urgency. She is wondering if it could be due to eating more sugar. She states that vaginal pressure feels about the same. She did feel like it was much worse Monday after a very busy weekend. She did perform inverted lying position to help with this and feels like it was helpful.   PAIN:  Are you having pain? Yes NPRS scale: 1/10 Pain location: lower abdominal cramping/bloating; low back pain/hip  pain  Pain type: aching and burning Pain description: intermittent   Aggravating factors: no pattern Relieving factors: stretches  PRECAUTIONS: None  RED FLAGS: None   WEIGHT BEARING RESTRICTIONS: No  FALLS:  Has patient fallen in last 6 months? No  LIVING ENVIRONMENT: Lives with: lives with their family Lives in: House/apartment  OCCUPATION: retired  PLOF: Independent  PATIENT GOALS: strengthen pelvic floor  PERTINENT HISTORY:  L4-S1 degenerative disc, stenosis L5-S1  BOWEL MOVEMENT: Pain with bowel movement: No Type of bowel movement:Frequency every day to every other day and Strain Yes Fully empty rectum: No Leakage: Yes: when she is having IBS flare Pads: No Fiber supplement: No - no sugar wheat or dairy   URINATION: Pain with urination: No Fully empty bladder: Yes: sometimes will leak when she stands up Stream: Strong Urgency: Yes: will have urgency, rarely leaks  Frequency: 2x/hour Leakage: Urge to void and Walking to the bathroom Pads: No  INTERCOURSE: Not sexually active, no history of pain  PREGNANCY: Vaginal deliveries 3 Tearing Yes: with first  PROLAPSE: Cystocele feels bulge in vagina   OBJECTIVE:  05/21/23: Core activation: able to contract transversus abdominus with multimodal cues; she reports being able to feel pelvic floor muscle contraction here No internal pelvic floor muscle exam performed this session, but improvement in patient proprioception when performing with transversus  abdominus    02/26/23:  COGNITION: Overall cognitive status: Within functional limits for tasks assessed     SENSATION: Light touch: Appears intact Proprioception: Appears intact  GAIT: Comments: WNL  POSTURE: rounded shoulders, forward head, decreased lumbar lordosis, and posterior pelvic tilt   PALPATION:   General  generalized abdominal restriction, no tenderness                External Perineal Exam some redness and white areas between labia; labial fusion                             Internal Pelvic Floor WNL  Patient confirms identification and approves PT to assess internal pelvic floor and treatment Yes  PELVIC MMT:   MMT eval  Vaginal 0/5, 1/5 with training, 3 second hold, 5 repeat contraction  Diastasis Recti 1 finger width  (Blank rows = not tested)        TONE: WNL  PROLAPSE: Grade 2 anterior vaginal wall laxity  TODAY'S TREATMENT:                                                                                                                              DATE:  06/04/23 Manual: Trigger Point Dry-Needling  Treatment instructions: Expect mild to moderate muscle soreness. S/S of pneumothorax if dry needled over a lung field, and to seek immediate medical attention should they occur. Patient verbalized understanding of these instructions and education.  Patient Consent Given: Yes Education handout provided: Yes Muscles treated: Lt glutes/TFL Electrical stimulation performed: No Parameters: N/A  Treatment response/outcome: twitch and release Soft tissue mobilization to Lt glutes  Neuromuscular re-education: Bil standing row green band 3 x 10 Bil shoulder extension green band 3 x 10 Pallof press green band 2 x 10 bil Therapeutic activities: Squat with red band around thighs 2 x 10 to table  3-way kick 10x each, bil Sidestepping red band 3 long laps    05/28/23 Neuromuscular re-education: Seated hip adduction ball press with transversus abdominus  and pelvic floor muscle 2 x 10 Seated hip abduction red band with transversus abdominus and pelvic floor muscle 2 x 10 Seated resisted march red band with transversus abdominus and pelvic floor muscle 2 x 10 Exercises: Seated thoracic extensions over foam roller 10x Wall clock with red band 5 rounds bil  Therapeutic activities: Squat with red band around thighs 2 x 10 to table  3-way kick 10x each, bil   05/21/23 RE-EVAL Neuromuscular re-education: Transversus abdominus training with multimodal cues for improved motor control and breath coordination Transversus abdominus isometrics 10x with pelvic floor muscle contraction Bil UE ball press 10x with transversus abdominus and pelvic floor muscle Bil hip adduction 10x with transversus abdominus and pelvic floor muscle contractions  Resisted march in supine with diagonal press 10x bil with cues for pelvic floor muscle and transversus abdominus  Exercises: Single knee to chest 10x bil  Therapeutic activities: Reviewed pelvic floor muscle contraction program Reviewed inverted lying position and performing at the end of each    PATIENT EDUCATION:  Education details: See above Person educated: Patient Education method: Programmer, multimedia, Demonstration, Actor cues, Verbal cues, and Handouts Education comprehension: verbalized understanding  HOME EXERCISE PROGRAM: [id number]  ASSESSMENT:  CLINICAL IMPRESSION: Pt not seeing much change in condition, but does feel like inverted lying is helpful when she is feeling more pressure. Believe that her increase in lower abdominal pain early this week may have been due to increase in prolapse after busy weekend. She did well with all core progressions and is very aware of appropriate core facilitation with breathing. Due to persistent LT hip pain during exercise that limits strengthening and activation of pelvic support muscles, dry needling performed to Lt glutes and tensor fascia latte. She toelrated  very well; she reported soreness afterwards; we discussed stretching and being active throughout the rest of the day. She will continue to benefit form skilled PT intervention in order to strengthen pelvic floor, improve pressure management, and begin/progress functional strengthening program.   OBJECTIVE IMPAIRMENTS: decreased activity tolerance, decreased coordination, decreased endurance, decreased strength, increased fascial restrictions, increased muscle spasms, postural dysfunction, and pain.   ACTIVITY LIMITATIONS: continence  PARTICIPATION LIMITATIONS: community activity  PERSONAL FACTORS: 1 comorbidity: medical history are also affecting patient's functional outcome.   REHAB POTENTIAL: Good  CLINICAL DECISION MAKING: Stable/uncomplicated  EVALUATION COMPLEXITY: Low   GOALS: Goals reviewed with patient? Yes  SHORT TERM GOALS: Target date: 03/26/23 - updated 04/07/23 - updated 05/21/23  Pt will be independent with HEP.   Baseline: Goal status: MET 04/07/23  2.  Pt will be independent with the knack, urge suppression technique, and double voiding in order to improve bladder habits and decrease urinary incontinence.   Baseline:  Goal status: MET 05/21/23  3.  Pt will be independent with use of squatty potty, relaxed toileting mechanics, and improved bowel movement techniques in order to increase ease of bowel movements and complete evacuation.   Baseline:  Goal status: MET 05/21/23  4.  Pt will be able to correctly perform diaphragmatic breathing and appropriate  pressure management in order to prevent worsening vaginal wall laxity and improve pelvic floor A/ROM.   Baseline:  Goal status: MET 05/21/23   LONG TERM GOALS: Target date: 04/23/23 - updated 04/07/23 - 05/21/23  Pt will be independent with advanced HEP.   Baseline:  Goal status: IN PROGRESS 05/21/23  2.  Pt will demonstrate normal pelvic floor muscle tone and A/ROM, able to achieve 4/5 strength with  contractions and 10 sec endurance, in order to provide appropriate lumbopelvic support in functional activities.   Baseline:  Goal status: IN PROGRESS 05/21/23  3.  Pt will demonstrate appropriate pressure management and breathing with squatting/carrying 15lb KB.  Baseline:  Goal status: IN PROGRESS 05/21/23  4.  Pt will be able to go 2-3 hours in between voids without urgency or incontinence in order to improve QOL and perform all functional activities with less difficulty.   Baseline:  Goal status:MET 05/21/23   PLAN:  PT FREQUENCY: 1-2x/week  PT DURATION: 8 weeks  PLANNED INTERVENTIONS: Therapeutic exercises, Therapeutic activity, Neuromuscular re-education, Balance training, Gait training, Patient/Family education, Self Care, Joint mobilization, Dry Needling, Biofeedback, and Manual therapy  PLAN FOR NEXT SESSION: progress core training; dry needling to Lt hip; likely discharge   Josette Mares, PT, DPT12/19/241:17 PM  PHYSICAL THERAPY DISCHARGE SUMMARY  Visits from Start of Care: 6  Current functional level related to goals / functional outcomes: Unknown   Remaining deficits: See above   Education / Equipment: HEP   Patient agrees to discharge. Patient goals were partially met. Patient is being discharged due to not returning since the last visit.  Josette Mares, PT, DPT08/09/2508:16 AM

## 2023-06-11 DIAGNOSIS — G4733 Obstructive sleep apnea (adult) (pediatric): Secondary | ICD-10-CM | POA: Diagnosis not present

## 2023-07-17 DIAGNOSIS — H903 Sensorineural hearing loss, bilateral: Secondary | ICD-10-CM | POA: Diagnosis not present

## 2023-07-31 DIAGNOSIS — D509 Iron deficiency anemia, unspecified: Secondary | ICD-10-CM | POA: Diagnosis not present

## 2023-07-31 DIAGNOSIS — D519 Vitamin B12 deficiency anemia, unspecified: Secondary | ICD-10-CM | POA: Diagnosis not present

## 2023-07-31 DIAGNOSIS — D649 Anemia, unspecified: Secondary | ICD-10-CM | POA: Diagnosis not present

## 2023-07-31 DIAGNOSIS — E54 Ascorbic acid deficiency: Secondary | ICD-10-CM | POA: Diagnosis not present

## 2023-07-31 DIAGNOSIS — E531 Pyridoxine deficiency: Secondary | ICD-10-CM | POA: Diagnosis not present

## 2023-07-31 DIAGNOSIS — E2839 Other primary ovarian failure: Secondary | ICD-10-CM | POA: Diagnosis not present

## 2023-07-31 DIAGNOSIS — T564X4A Toxic effect of copper and its compounds, undetermined, initial encounter: Secondary | ICD-10-CM | POA: Diagnosis not present

## 2023-09-17 DIAGNOSIS — Z85820 Personal history of malignant melanoma of skin: Secondary | ICD-10-CM | POA: Diagnosis not present

## 2023-09-17 DIAGNOSIS — C4372 Malignant melanoma of left lower limb, including hip: Secondary | ICD-10-CM | POA: Diagnosis not present

## 2023-09-17 DIAGNOSIS — L821 Other seborrheic keratosis: Secondary | ICD-10-CM | POA: Diagnosis not present

## 2023-09-17 DIAGNOSIS — D225 Melanocytic nevi of trunk: Secondary | ICD-10-CM | POA: Diagnosis not present

## 2023-09-17 DIAGNOSIS — L570 Actinic keratosis: Secondary | ICD-10-CM | POA: Diagnosis not present

## 2023-09-17 DIAGNOSIS — L603 Nail dystrophy: Secondary | ICD-10-CM | POA: Diagnosis not present

## 2023-10-06 DIAGNOSIS — C4372 Malignant melanoma of left lower limb, including hip: Secondary | ICD-10-CM | POA: Diagnosis not present

## 2023-11-23 DIAGNOSIS — M79642 Pain in left hand: Secondary | ICD-10-CM | POA: Diagnosis not present

## 2023-11-23 DIAGNOSIS — M79641 Pain in right hand: Secondary | ICD-10-CM | POA: Diagnosis not present

## 2023-11-23 DIAGNOSIS — T691XXA Chilblains, initial encounter: Secondary | ICD-10-CM | POA: Diagnosis not present

## 2023-11-23 DIAGNOSIS — I7300 Raynaud's syndrome without gangrene: Secondary | ICD-10-CM | POA: Diagnosis not present

## 2023-11-23 DIAGNOSIS — R5383 Other fatigue: Secondary | ICD-10-CM | POA: Diagnosis not present

## 2023-12-10 DIAGNOSIS — T691XXA Chilblains, initial encounter: Secondary | ICD-10-CM | POA: Diagnosis not present

## 2023-12-10 DIAGNOSIS — L609 Nail disorder, unspecified: Secondary | ICD-10-CM | POA: Diagnosis not present

## 2023-12-10 DIAGNOSIS — M79641 Pain in right hand: Secondary | ICD-10-CM | POA: Diagnosis not present

## 2023-12-10 DIAGNOSIS — I7300 Raynaud's syndrome without gangrene: Secondary | ICD-10-CM | POA: Diagnosis not present

## 2023-12-11 ENCOUNTER — Other Ambulatory Visit: Payer: Self-pay | Admitting: Obstetrics and Gynecology

## 2023-12-11 DIAGNOSIS — N644 Mastodynia: Secondary | ICD-10-CM

## 2023-12-22 ENCOUNTER — Ambulatory Visit
Admission: RE | Admit: 2023-12-22 | Discharge: 2023-12-22 | Disposition: A | Discharge status: Home / Self Care | Source: Ambulatory Visit | Attending: Obstetrics and Gynecology | Admitting: Obstetrics and Gynecology

## 2023-12-22 DIAGNOSIS — N644 Mastodynia: Secondary | ICD-10-CM

## 2024-04-15 ENCOUNTER — Other Ambulatory Visit: Payer: Self-pay | Admitting: Physical Medicine and Rehabilitation

## 2024-04-15 DIAGNOSIS — G459 Transient cerebral ischemic attack, unspecified: Secondary | ICD-10-CM

## 2024-04-15 DIAGNOSIS — G3184 Mild cognitive impairment, so stated: Secondary | ICD-10-CM

## 2024-04-15 DIAGNOSIS — R42 Dizziness and giddiness: Secondary | ICD-10-CM

## 2024-04-18 ENCOUNTER — Ambulatory Visit
Admission: RE | Admit: 2024-04-18 | Discharge: 2024-04-18 | Disposition: A | Discharge status: Home / Self Care | Source: Ambulatory Visit | Attending: Physical Medicine and Rehabilitation | Admitting: Physical Medicine and Rehabilitation

## 2024-04-18 ENCOUNTER — Other Ambulatory Visit

## 2024-04-18 DIAGNOSIS — R42 Dizziness and giddiness: Secondary | ICD-10-CM

## 2024-04-18 DIAGNOSIS — G3184 Mild cognitive impairment, so stated: Secondary | ICD-10-CM

## 2024-04-18 DIAGNOSIS — G459 Transient cerebral ischemic attack, unspecified: Secondary | ICD-10-CM

## 2024-05-06 ENCOUNTER — Other Ambulatory Visit
